# Patient Record
Sex: Female | Born: 1985 | Race: Asian | Hispanic: No | Marital: Married | Smoking: Never smoker
Health system: Southern US, Community
[De-identification: ages and names within clinical notes are randomized; demographics above are authoritative.]

## PROBLEM LIST (undated history)

## (undated) DIAGNOSIS — Z789 Other specified health status: Secondary | ICD-10-CM

## (undated) DIAGNOSIS — R768 Other specified abnormal immunological findings in serum: Secondary | ICD-10-CM

## (undated) DIAGNOSIS — J189 Pneumonia, unspecified organism: Secondary | ICD-10-CM

## (undated) HISTORY — PX: WISDOM TOOTH EXTRACTION: SHX21

---

## 2020-05-06 DIAGNOSIS — N764 Abscess of vulva: Secondary | ICD-10-CM

## 2020-05-06 HISTORY — DX: Abscess of vulva: N76.4

## 2020-07-25 DIAGNOSIS — K219 Gastro-esophageal reflux disease without esophagitis: Secondary | ICD-10-CM | POA: Diagnosis not present

## 2020-07-25 DIAGNOSIS — K6289 Other specified diseases of anus and rectum: Secondary | ICD-10-CM | POA: Diagnosis not present

## 2020-07-26 ENCOUNTER — Inpatient Hospital Stay (HOSPITAL_COMMUNITY)
Admission: EM | Admit: 2020-07-26 | Discharge: 2020-07-27 | DRG: 746 | Disposition: A | Discharge status: Home / Self Care | Payer: 59 | Attending: General Surgery | Admitting: General Surgery

## 2020-07-26 ENCOUNTER — Emergency Department (HOSPITAL_COMMUNITY): Payer: 59 | Admitting: Anesthesiology

## 2020-07-26 ENCOUNTER — Emergency Department (HOSPITAL_COMMUNITY): Payer: 59

## 2020-07-26 ENCOUNTER — Encounter (HOSPITAL_COMMUNITY): Admission: EM | Disposition: A | Discharge status: Home / Self Care | Payer: Self-pay | Source: Home / Self Care

## 2020-07-26 ENCOUNTER — Encounter (HOSPITAL_COMMUNITY): Payer: Self-pay

## 2020-07-26 ENCOUNTER — Other Ambulatory Visit: Payer: Self-pay

## 2020-07-26 DIAGNOSIS — N762 Acute vulvitis: Secondary | ICD-10-CM | POA: Diagnosis present

## 2020-07-26 DIAGNOSIS — Z20822 Contact with and (suspected) exposure to covid-19: Secondary | ICD-10-CM | POA: Diagnosis not present

## 2020-07-26 DIAGNOSIS — N764 Abscess of vulva: Secondary | ICD-10-CM | POA: Diagnosis not present

## 2020-07-26 DIAGNOSIS — R1900 Intra-abdominal and pelvic swelling, mass and lump, unspecified site: Secondary | ICD-10-CM | POA: Diagnosis not present

## 2020-07-26 DIAGNOSIS — K769 Liver disease, unspecified: Secondary | ICD-10-CM | POA: Diagnosis not present

## 2020-07-26 DIAGNOSIS — L0291 Cutaneous abscess, unspecified: Secondary | ICD-10-CM | POA: Diagnosis present

## 2020-07-26 DIAGNOSIS — K611 Rectal abscess: Secondary | ICD-10-CM | POA: Diagnosis not present

## 2020-07-26 DIAGNOSIS — K610 Anal abscess: Secondary | ICD-10-CM | POA: Diagnosis present

## 2020-07-26 DIAGNOSIS — N949 Unspecified condition associated with female genital organs and menstrual cycle: Secondary | ICD-10-CM | POA: Diagnosis not present

## 2020-07-26 DIAGNOSIS — K644 Residual hemorrhoidal skin tags: Secondary | ICD-10-CM | POA: Diagnosis not present

## 2020-07-26 HISTORY — PX: INCISION AND DRAINAGE PERIRECTAL ABSCESS: SHX1804

## 2020-07-26 LAB — BASIC METABOLIC PANEL
Anion gap: 7 (ref 5–15)
BUN: 10 mg/dL (ref 6–20)
CO2: 22 mmol/L (ref 22–32)
Calcium: 8.9 mg/dL (ref 8.9–10.3)
Chloride: 107 mmol/L (ref 98–111)
Creatinine, Ser: 0.65 mg/dL (ref 0.44–1.00)
GFR, Estimated: >60 mL/min (ref >60)
Glucose, Bld: 105 mg/dL — ABNORMAL HIGH (ref 70–99)
Potassium: 3.5 mmol/L (ref 3.5–5.1)
Sodium: 136 mmol/L (ref 135–145)

## 2020-07-26 LAB — CBC WITH DIFFERENTIAL/PLATELET
Abs Immature Granulocytes: 0.04 10*3/uL (ref 0.00–0.07)
Basophils Absolute: 0 10*3/uL (ref 0.0–0.1)
Basophils Relative: 0 %
Eosinophils Absolute: 0 10*3/uL (ref 0.0–0.5)
Eosinophils Relative: 0 %
HCT: 38.3 % (ref 36.0–46.0)
Hemoglobin: 12.9 g/dL (ref 12.0–15.0)
Immature Granulocytes: 0 %
Lymphocytes Relative: 10 %
Lymphs Abs: 1.3 10*3/uL (ref 0.7–4.0)
MCH: 30.4 pg (ref 26.0–34.0)
MCHC: 33.7 g/dL (ref 30.0–36.0)
MCV: 90.1 fL (ref 80.0–100.0)
Monocytes Absolute: 0.8 10*3/uL (ref 0.1–1.0)
Monocytes Relative: 6 %
Neutro Abs: 11.3 10*3/uL — ABNORMAL HIGH (ref 1.7–7.7)
Neutrophils Relative %: 84 %
Platelets: 193 10*3/uL (ref 150–400)
RBC: 4.25 MIL/uL (ref 3.87–5.11)
RDW: 12 % (ref 11.5–15.5)
WBC: 13.5 10*3/uL — ABNORMAL HIGH (ref 4.0–10.5)
nRBC: 0 % (ref 0.0–0.2)

## 2020-07-26 LAB — RESP PANEL BY RT-PCR (FLU A&B, COVID) ARPGX2
Influenza A by PCR: NEGATIVE
Influenza B by PCR: NEGATIVE
SARS Coronavirus 2 by RT PCR: NEGATIVE

## 2020-07-26 LAB — WET PREP, GENITAL
Clue Cells Wet Prep HPF POC: NONE SEEN
Sperm: NONE SEEN
Trich, Wet Prep: NONE SEEN
Yeast Wet Prep HPF POC: NONE SEEN

## 2020-07-26 LAB — I-STAT BETA HCG BLOOD, ED (MC, WL, AP ONLY): I-stat hCG, quantitative: <5 m[IU]/mL (ref <5)

## 2020-07-26 LAB — HIV ANTIBODY (ROUTINE TESTING W REFLEX): HIV Screen 4th Generation wRfx: NONREACTIVE

## 2020-07-26 LAB — LACTIC ACID, PLASMA: Lactic Acid, Venous: 1.2 mmol/L (ref 0.5–1.9)

## 2020-07-26 IMAGING — CT CT ABD-PELV W/ CM
2 of 4 series · 15 of 46 positions shown, 17 images · IV contrast (omnipaque)
Comparison: None.

CLINICAL DATA: Labial pain and swelling.

EXAM:
CT ABDOMEN AND PELVIS WITH CONTRAST
TECHNIQUE: Multidetector CT imaging of the abdomen and pelvis was performed
using the standard protocol following bolus administration of
intravenous contrast.
CONTRAST:  100mL OMNIPAQUE IOHEXOL 300 MG/ML  SOLN

[Series 3: a/p w/ 5mm · axial · 0.87mm/px · z∈[+1217,+1747]mm · 12 of 116 slices shown, 14 images]
[im 5/116  soft-tissue]
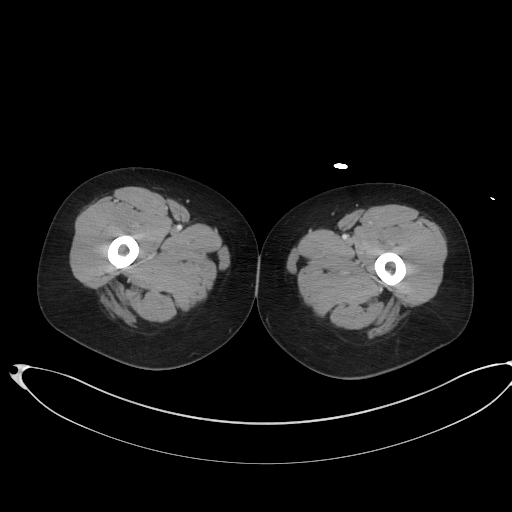
[im 5/116  bone]
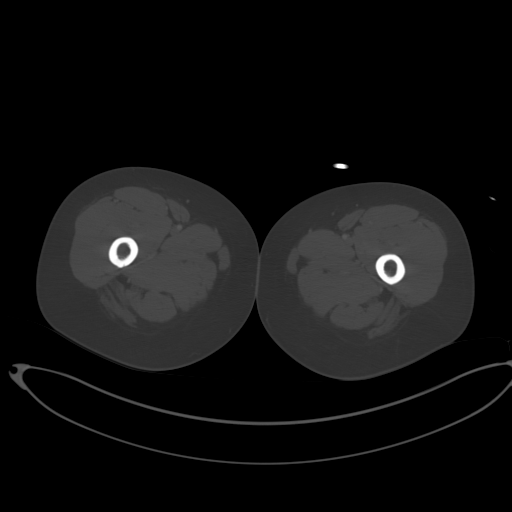
[im 15/116  soft-tissue]
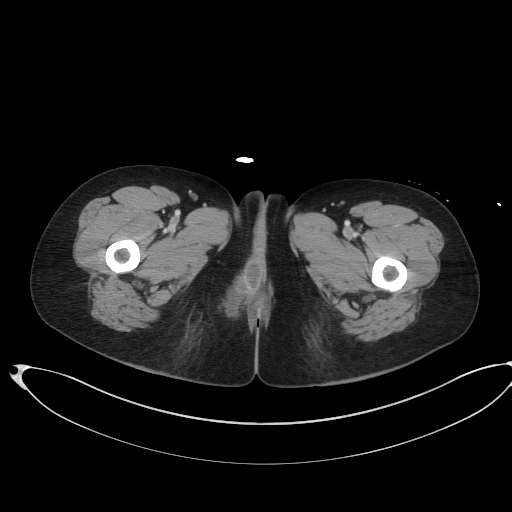
[im 24/116  soft-tissue]
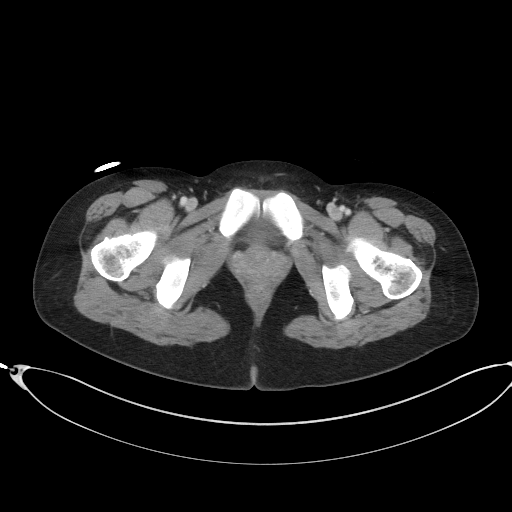
[im 34/116  soft-tissue]
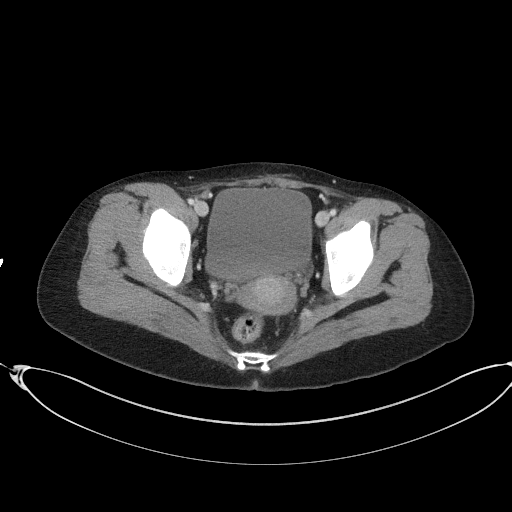
[im 44/116  soft-tissue]
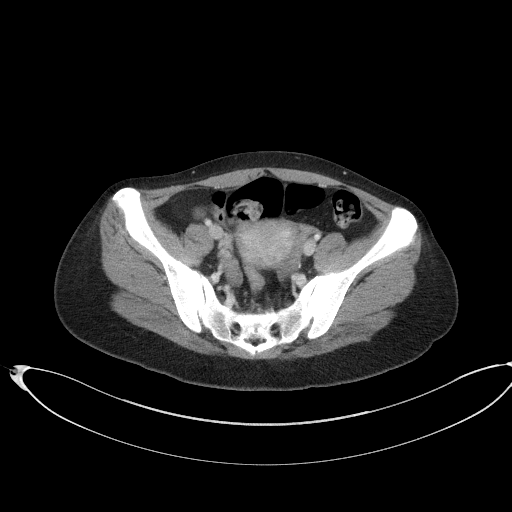
[im 53/116  soft-tissue]
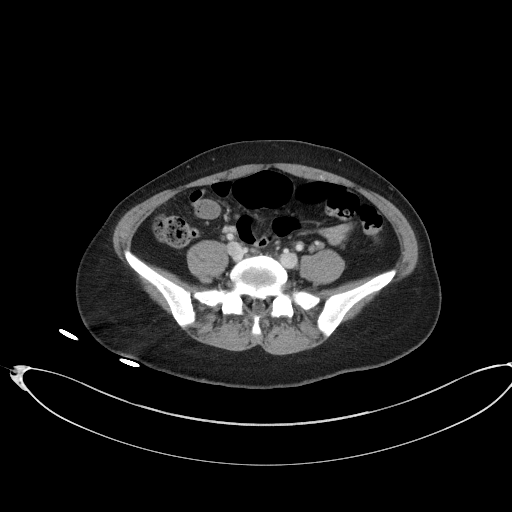
[im 63/116  soft-tissue]
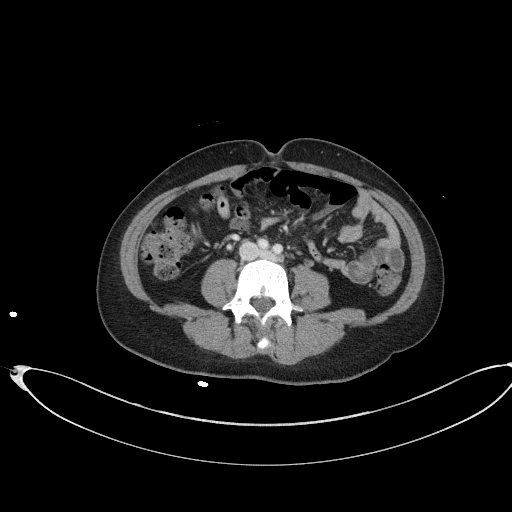
[im 72/116  soft-tissue]
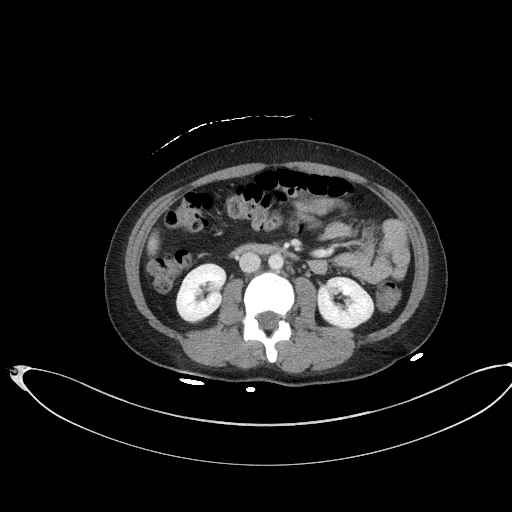
[im 82/116  soft-tissue]
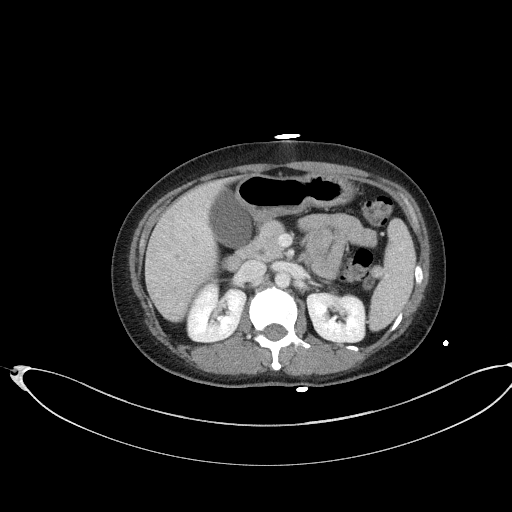
[im 82/116  bone]
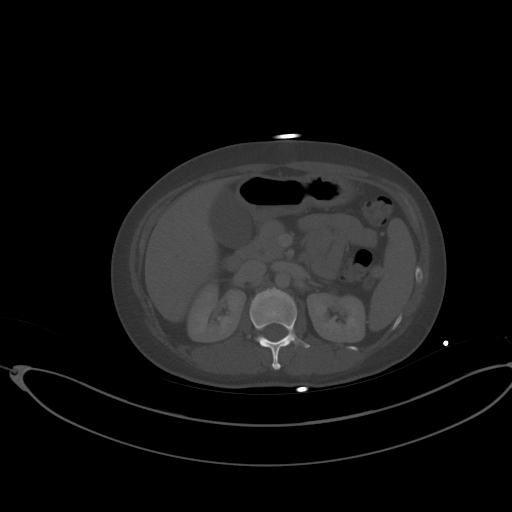
[im 92/116  soft-tissue]
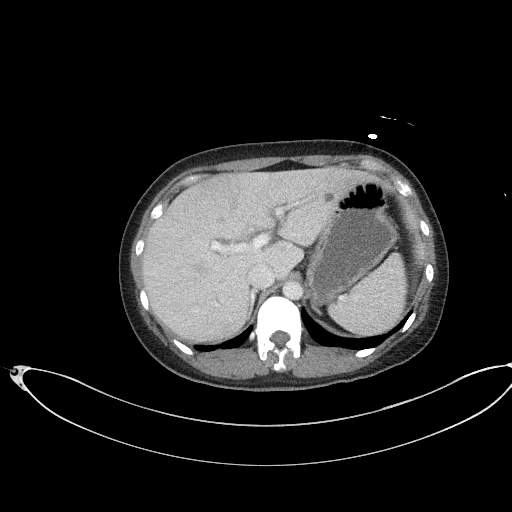
[im 101/116  soft-tissue]
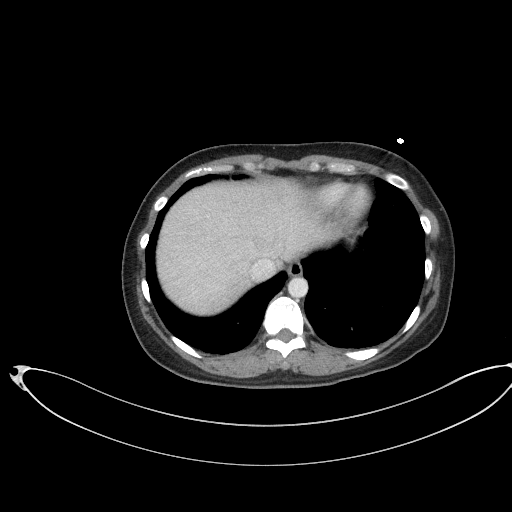
[im 111/116  soft-tissue]
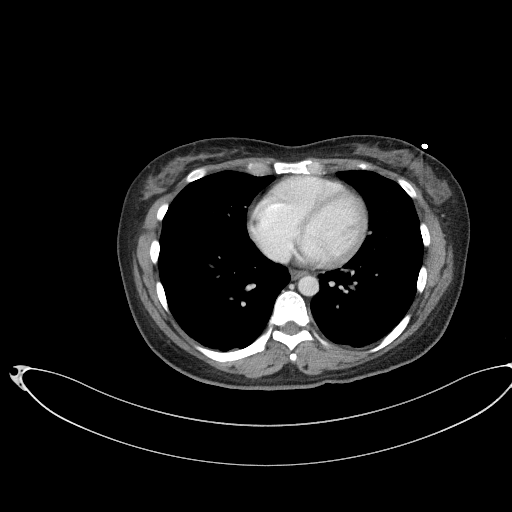

[Series 6: a/p w/ cor · coronal · 0.80mm/px · 3 of 115 slices shown]
[im 39/115  soft-tissue]
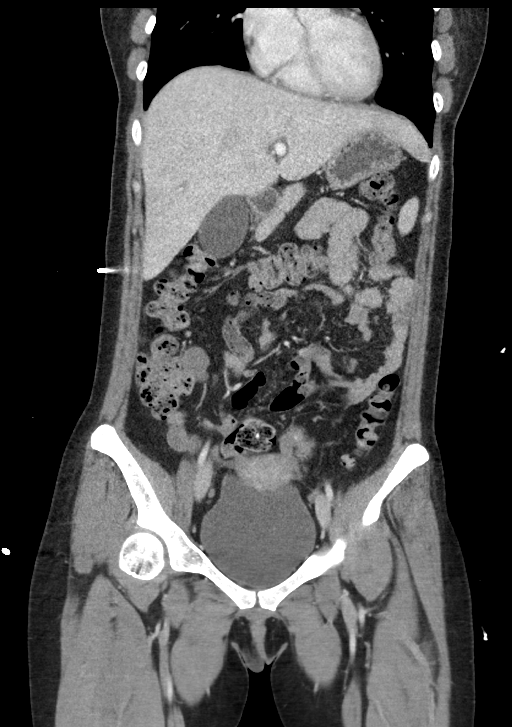
[im 51/115  soft-tissue]
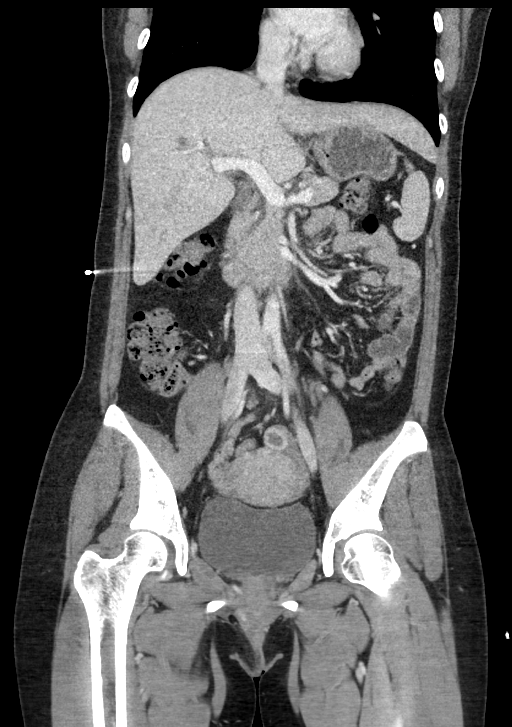
[im 64/115  soft-tissue]
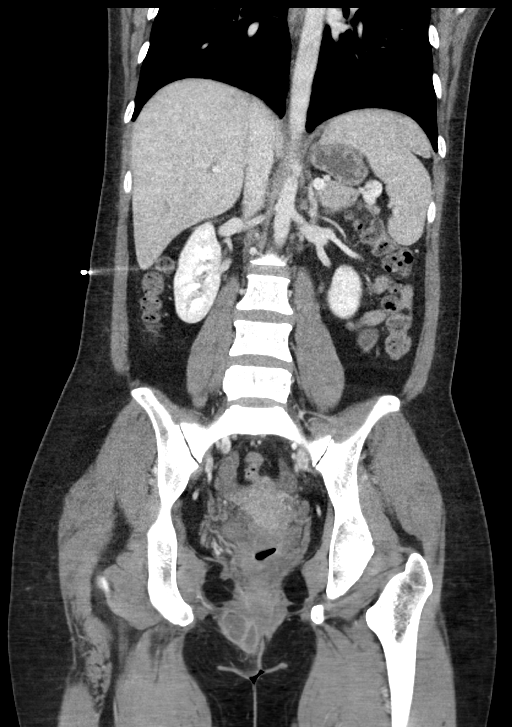

[15 of 46 positions shown; findings below may reference images not displayed]

FINDINGS: Lower chest: The lung bases are clear of acute process. No pleural
effusion or pulmonary lesions. The heart is normal in size. No
pericardial effusion. The distal esophagus and aorta are
unremarkable.

Hepatobiliary: Small low-attenuation liver lesions consistent with
benign cysts. No worrisome hepatic lesions or intrahepatic biliary
dilatation. The gallbladder is unremarkable. No common bile duct
dilatation.

Pancreas: No mass, inflammation or ductal dilatation.

Spleen: Normal size.  No focal lesions.

Adrenals/Urinary Tract: The adrenal glands and kidneys are normal.
The bladder is normal.

Stomach/Bowel: The stomach, duodenum, small bowel and colon are
unremarkable. No acute inflammatory changes, mass lesions or
obstructive findings. The terminal ileum is normal. The appendix is
normal.

Vascular/Lymphatic: The aorta is normal in caliber. No dissection.
The branch vessels are patent. The major venous structures are
patent. No mesenteric or retroperitoneal mass or adenopathy. Small
scattered lymph nodes are noted.

Reproductive: The uterus and ovaries are unremarkable.

Other: There is a complex rim enhancing fluid collection noted in
the right perineum. This is in close proximity to the lower anus and
could potentially be due to a perianal fistula although I do not see
one for certain. The other possibility would be some type infected
lower para vaginal cyst such as a Bartholin's gland abscess. MRI
pelvis without and with contrast using perianal fistula protocol may
be helpful for further evaluation if clinically necessary. This
abscess measures a maximum of 4.8 cm.

Musculoskeletal: No significant bony findings.
IMPRESSION: 1. 4.8 cm complex rim enhancing fluid collection in the right
perineum in close proximity to the anterior lower anus and could
potentially be due to a perianal fistula although I do not see one
for certain. The other possibility would be some type infected lower
paravaginal cyst such as a Bartholin's gland abscess. MRI pelvis
without and with contrast using perianal fistula protocol may be
helpful for further evaluation if clinically necessary.
2. No acute abdominal/pelvic findings, mass lesions or adenopathy.

## 2020-07-26 SURGERY — INCISION AND DRAINAGE, ABSCESS, PERIRECTAL
Anesthesia: General | Laterality: Right

## 2020-07-26 MED ORDER — EPHEDRINE 5 MG/ML INJ
INTRAVENOUS | Status: AC
  Filled 2020-07-26: qty 10

## 2020-07-26 MED ORDER — ONDANSETRON HCL 4 MG/2ML IJ SOLN
4.0000 mg | Freq: Once | INTRAMUSCULAR | Status: AC
  Administered 2020-07-26: 4 mg via INTRAVENOUS
  Filled 2020-07-26: qty 2

## 2020-07-26 MED ORDER — SUCCINYLCHOLINE CHLORIDE 20 MG/ML IJ SOLN
INTRAMUSCULAR | Status: DC | PRN
  Administered 2020-07-26: 100 mg via INTRAVENOUS

## 2020-07-26 MED ORDER — TRAMADOL HCL 50 MG PO TABS
50.0000 mg | ORAL_TABLET | Freq: Four times a day (QID) | ORAL | Status: DC | PRN
  Administered 2020-07-27: 50 mg via ORAL
  Filled 2020-07-26: qty 1

## 2020-07-26 MED ORDER — MIDAZOLAM HCL 2 MG/2ML IJ SOLN
INTRAMUSCULAR | Status: AC
  Filled 2020-07-26: qty 2

## 2020-07-26 MED ORDER — LIDOCAINE 2% (20 MG/ML) 5 ML SYRINGE
INTRAMUSCULAR | Status: AC
  Filled 2020-07-26: qty 5

## 2020-07-26 MED ORDER — ACETAMINOPHEN 10 MG/ML IV SOLN: 1000.0000 mg | Freq: Once | INTRAVENOUS | Status: DC | PRN

## 2020-07-26 MED ORDER — ONDANSETRON HCL 4 MG/2ML IJ SOLN
INTRAMUSCULAR | Status: DC | PRN
  Administered 2020-07-26: 4 mg via INTRAVENOUS

## 2020-07-26 MED ORDER — ADULT MULTIVITAMIN W/MINERALS CH
1.0000 | ORAL_TABLET | Freq: Every day | ORAL | Status: DC
  Administered 2020-07-27: 1 via ORAL
  Filled 2020-07-26: qty 1

## 2020-07-26 MED ORDER — PROPOFOL 10 MG/ML IV BOLUS
INTRAVENOUS | Status: DC | PRN
  Administered 2020-07-26: 130 mg via INTRAVENOUS

## 2020-07-26 MED ORDER — PIPERACILLIN-TAZOBACTAM 3.375 G IVPB 30 MIN
3.3750 g | Freq: Once | INTRAVENOUS | Status: AC
  Administered 2020-07-26: 3.375 g via INTRAVENOUS
  Filled 2020-07-26: qty 50

## 2020-07-26 MED ORDER — OXYCODONE-ACETAMINOPHEN 5-325 MG PO TABS
1.0000 | ORAL_TABLET | Freq: Once | ORAL | Status: AC
  Administered 2020-07-26: 1 via ORAL
  Filled 2020-07-26: qty 1

## 2020-07-26 MED ORDER — OXYCODONE HCL 5 MG PO TABS: 5.0000 mg | ORAL_TABLET | Freq: Once | ORAL | Status: DC | PRN

## 2020-07-26 MED ORDER — DOCUSATE SODIUM 100 MG PO CAPS
100.0000 mg | ORAL_CAPSULE | Freq: Two times a day (BID) | ORAL | Status: DC
  Administered 2020-07-26 – 2020-07-27 (×2): 100 mg via ORAL
  Filled 2020-07-26 (×3): qty 1

## 2020-07-26 MED ORDER — IBUPROFEN 400 MG PO TABS
400.0000 mg | ORAL_TABLET | Freq: Once | ORAL | Status: AC
  Administered 2020-07-26: 400 mg via ORAL
  Filled 2020-07-26: qty 1

## 2020-07-26 MED ORDER — ACETAMINOPHEN 500 MG PO TABS
1000.0000 mg | ORAL_TABLET | Freq: Three times a day (TID) | ORAL | Status: DC
  Administered 2020-07-26 – 2020-07-27 (×2): 1000 mg via ORAL
  Filled 2020-07-26 (×3): qty 2

## 2020-07-26 MED ORDER — ROCURONIUM BROMIDE 10 MG/ML (PF) SYRINGE
PREFILLED_SYRINGE | INTRAVENOUS | Status: AC
  Filled 2020-07-26: qty 10

## 2020-07-26 MED ORDER — IBUPROFEN 200 MG PO TABS: 200.0000 mg | ORAL_TABLET | Freq: Four times a day (QID) | ORAL | Status: DC | PRN

## 2020-07-26 MED ORDER — FENTANYL CITRATE (PF) 100 MCG/2ML IJ SOLN
25.0000 ug | INTRAMUSCULAR | Status: DC | PRN
Start: 2020-07-26 — End: 2020-07-26

## 2020-07-26 MED ORDER — PHENYLEPHRINE 40 MCG/ML (10ML) SYRINGE FOR IV PUSH (FOR BLOOD PRESSURE SUPPORT)
PREFILLED_SYRINGE | INTRAVENOUS | Status: AC
  Filled 2020-07-26: qty 10

## 2020-07-26 MED ORDER — ONDANSETRON 4 MG PO TBDP: 4.0000 mg | ORAL_TABLET | Freq: Four times a day (QID) | ORAL | Status: DC | PRN

## 2020-07-26 MED ORDER — MIDAZOLAM HCL 5 MG/5ML IJ SOLN
INTRAMUSCULAR | Status: DC | PRN
  Administered 2020-07-26: 2 mg via INTRAVENOUS

## 2020-07-26 MED ORDER — LACTATED RINGERS IV SOLN: INTRAVENOUS | Status: DC | PRN

## 2020-07-26 MED ORDER — FENTANYL CITRATE (PF) 250 MCG/5ML IJ SOLN
INTRAMUSCULAR | Status: AC
  Filled 2020-07-26: qty 5

## 2020-07-26 MED ORDER — HYDROMORPHONE HCL 1 MG/ML IJ SOLN
0.5000 mg | Freq: Once | INTRAMUSCULAR | Status: AC
  Administered 2020-07-26: 0.5 mg via INTRAVENOUS
  Filled 2020-07-26: qty 1

## 2020-07-26 MED ORDER — DEXAMETHASONE SODIUM PHOSPHATE 10 MG/ML IJ SOLN
INTRAMUSCULAR | Status: DC | PRN
  Administered 2020-07-26: 5 mg via INTRAVENOUS

## 2020-07-26 MED ORDER — ONDANSETRON HCL 4 MG/2ML IJ SOLN
INTRAMUSCULAR | Status: AC
  Filled 2020-07-26: qty 2

## 2020-07-26 MED ORDER — SUCCINYLCHOLINE CHLORIDE 200 MG/10ML IV SOSY
PREFILLED_SYRINGE | INTRAVENOUS | Status: AC
  Filled 2020-07-26: qty 10

## 2020-07-26 MED ORDER — 0.9 % SODIUM CHLORIDE (POUR BTL) OPTIME
TOPICAL | Status: DC | PRN
  Administered 2020-07-26: 1000 mL

## 2020-07-26 MED ORDER — LIDOCAINE-EPINEPHRINE 1 %-1:100000 IJ SOLN
20.0000 mL | Freq: Once | INTRAMUSCULAR | Status: DC
  Filled 2020-07-26: qty 1

## 2020-07-26 MED ORDER — PROPOFOL 10 MG/ML IV BOLUS
INTRAVENOUS | Status: AC
  Filled 2020-07-26: qty 20

## 2020-07-26 MED ORDER — IOHEXOL 300 MG/ML  SOLN
100.0000 mL | Freq: Once | INTRAMUSCULAR | Status: AC | PRN
  Administered 2020-07-26: 100 mL via INTRAVENOUS

## 2020-07-26 MED ORDER — AMOXICILLIN-POT CLAVULANATE 875-125 MG PO TABS
1.0000 | ORAL_TABLET | Freq: Two times a day (BID) | ORAL | Status: DC
  Administered 2020-07-26 – 2020-07-27 (×2): 1 via ORAL
  Filled 2020-07-26 (×2): qty 1

## 2020-07-26 MED ORDER — OXYCODONE HCL 5 MG/5ML PO SOLN: 5.0000 mg | Freq: Once | ORAL | Status: DC | PRN

## 2020-07-26 MED ORDER — SODIUM CHLORIDE 0.9 % IV BOLUS
1000.0000 mL | Freq: Once | INTRAVENOUS | Status: AC
  Administered 2020-07-26: 1000 mL via INTRAVENOUS

## 2020-07-26 MED ORDER — LIDOCAINE HCL (CARDIAC) PF 100 MG/5ML IV SOSY
PREFILLED_SYRINGE | INTRAVENOUS | Status: DC | PRN
  Administered 2020-07-26: 40 mg via INTRAVENOUS

## 2020-07-26 MED ORDER — ONDANSETRON HCL 4 MG/2ML IJ SOLN: 4.0000 mg | Freq: Four times a day (QID) | INTRAMUSCULAR | Status: DC | PRN

## 2020-07-26 MED ORDER — ACETAMINOPHEN 10 MG/ML IV SOLN
INTRAVENOUS | Status: DC | PRN
  Administered 2020-07-26: 1000 mg via INTRAVENOUS

## 2020-07-26 MED ORDER — FENTANYL CITRATE (PF) 250 MCG/5ML IJ SOLN
INTRAMUSCULAR | Status: DC | PRN
  Administered 2020-07-26: 100 ug via INTRAVENOUS

## 2020-07-26 MED ORDER — ACETAMINOPHEN 500 MG PO TABS: 1000.0000 mg | ORAL_TABLET | Freq: Once | ORAL | Status: DC | PRN

## 2020-07-26 MED ORDER — ACETAMINOPHEN 160 MG/5ML PO SOLN: 1000.0000 mg | Freq: Once | ORAL | Status: DC | PRN

## 2020-07-26 SURGICAL SUPPLY — 30 items
BNDG GAUZE ELAST 4 BULKY (GAUZE/BANDAGES/DRESSINGS) IMPLANT
COVER MAYO STAND STRL (DRAPES) ×2 IMPLANT
COVER SURGICAL LIGHT HANDLE (MISCELLANEOUS) ×2 IMPLANT
COVER WAND RF STERILE (DRAPES) ×2 IMPLANT
DRSG PAD ABDOMINAL 8X10 ST (GAUZE/BANDAGES/DRESSINGS) ×2 IMPLANT
ELECT CAUTERY BLADE 6.4 (BLADE) ×2 IMPLANT
ELECT REM PT RETURN 9FT ADLT (ELECTROSURGICAL) ×2
ELECTRODE REM PT RTRN 9FT ADLT (ELECTROSURGICAL) ×1 IMPLANT
GAUZE PACKING IODOFORM 1/2 (PACKING) ×2 IMPLANT
GAUZE SPONGE 4X4 12PLY STRL (GAUZE/BANDAGES/DRESSINGS) ×2 IMPLANT
GLOVE BIOGEL PI IND STRL 6 (GLOVE) ×1 IMPLANT
GLOVE BIOGEL PI INDICATOR 6 (GLOVE) ×1
GLOVE BIOGEL PI MICRO 5.5 (GLOVE) ×1
GLOVE BIOGEL PI MICRO STRL 5.5 (GLOVE) ×1 IMPLANT
GOWN STRL REUS W/ TWL LRG LVL3 (GOWN DISPOSABLE) ×2 IMPLANT
GOWN STRL REUS W/TWL LRG LVL3 (GOWN DISPOSABLE) ×2
KIT BASIN OR (CUSTOM PROCEDURE TRAY) ×2 IMPLANT
KIT TURNOVER KIT B (KITS) ×2 IMPLANT
NS IRRIG 1000ML POUR BTL (IV SOLUTION) ×2 IMPLANT
PACK GENERAL/GYN (CUSTOM PROCEDURE TRAY) ×2 IMPLANT
PACK LITHOTOMY IV (CUSTOM PROCEDURE TRAY) ×2 IMPLANT
PAD ARMBOARD 7.5X6 YLW CONV (MISCELLANEOUS) ×2 IMPLANT
PENCIL SMOKE EVACUATOR (MISCELLANEOUS) ×2 IMPLANT
SPONGE LAP 18X18 RF (DISPOSABLE) ×2 IMPLANT
SURGILUBE 2OZ TUBE FLIPTOP (MISCELLANEOUS) ×2 IMPLANT
SYR BULB EAR ULCER 3OZ GRN STR (SYRINGE) ×2 IMPLANT
TOWEL GREEN STERILE (TOWEL DISPOSABLE) ×2 IMPLANT
TOWEL GREEN STERILE FF (TOWEL DISPOSABLE) ×2 IMPLANT
TUBE CONNECTING 12X1/4 (SUCTIONS) ×2 IMPLANT
YANKAUER SUCT BULB TIP NO VENT (SUCTIONS) ×2 IMPLANT

## 2020-07-26 NOTE — H&P (Signed)
Sabrina Mullins [birth year]  [id number].    Chief Complaint/Reason for Consult: labial/perianal abscess  HPI:  Sabrina Mullins is a 35 yo female who has been having pain on her right labia and perianal area for the last 6 days. Sabrina Mullins has not had any fevers, chills, or drainage. Sabrina Mullins thought Sabrina Mullins had hemorrhoids and was seen by GI, who referred her to our clinic today. Sabrina Mullins was seen by Dr. Cliffton Asters in clinic this morning, and there was induration with tenderness of the right posterior labia but no fluctuance or obvious abscess. Sabrina Mullins was sent to the ED for imaging. A CT scan showed a 5cm rim-enhancing fluid collection in the right perineum, tracking toward the anus. Labs are significant for a WBC of 13. Attempts were made in the ED to aspirate and drain the abscess but were not successful.  ROS: Review of Systems  Constitutional: Negative for chills and fever.  Respiratory: Negative for shortness of breath.   Cardiovascular: Negative for chest pain.  Genitourinary: Positive for dysuria.  Neurological: Negative for weakness.    History reviewed. No pertinent family history.  History reviewed. No pertinent past medical history.  History reviewed. No pertinent surgical history.  Social History: Denies EtOH and smoking. Works as an Airline pilot.  Allergies: Not on File  (Not in a hospital admission)    Physical Exam: Blood pressure 99/73, pulse 84, temperature 98.5 F (36.9 C), resp. rate 18, SpO2 98 %. General: resting comfortably, appears stated age, no apparent distress Neurological: alert and oriented, no focal deficits, cranial nerves grossly in tact HEENT: normocephalic, atraumatic, oropharynx clear, no scleral icterus CV: regular rate and rhythm, extremities warm and well-perfused Respiratory: normal work of breathing, symmetric chest wall expansion Anorectal/GU: induration and mild erythema of the right posterior labia with exquisite tenderness to palpation. Induration tracks onto the perineum  toward the right anterior perianal tissue, which is also tender to palpation. Non-thrombosed external hemorrhoids. Extremities: warm and well-perfused, no deformities, moving all extremities spontaneously Psychiatric: normal mood and affect Skin: warm and dry, no jaundice, no rashes or lesions   Results for orders placed or performed during the hospital encounter of 07/26/20 (from the past 48 hour(s))  Wet prep, genital     Status: Abnormal   Collection Time: 07/26/20  2:11 PM  Result Value Ref Range   Yeast Wet Prep HPF POC NONE SEEN NONE SEEN   Trich, Wet Prep NONE SEEN NONE SEEN   Clue Cells Wet Prep HPF POC NONE SEEN NONE SEEN   WBC, Wet Prep HPF POC MANY (A) NONE SEEN   Sperm NONE SEEN     Comment: Performed at [facility], [address], [place], [place] [address]  CBC with Differential/Platelet     Status: Abnormal   Collection Time: 07/26/20  4:44 PM  Result Value Ref Range   WBC 13.5 (H) 4.0 - 10.5 K/uL   RBC 4.25 3.87 - 5.11 MIL/uL   Hemoglobin 12.9 12.0 - 15.0 g/dL   HCT 16.0 10.9 - 32.3 %   MCV 90.1 80.0 - 100.0 fL   MCH 30.4 26.0 - 34.0 pg   MCHC 33.7 30.0 - 36.0 g/dL   RDW 55.7 32.2 - 02.5 %   Platelets 193 150 - 400 K/uL   nRBC 0.0 0.0 - 0.2 %   Neutrophils Relative % 84 %   Neutro Abs 11.3 (H) 1.7 - 7.7 K/uL   Lymphocytes Relative 10 %   Lymphs Abs 1.3 0.7 - 4.0  K/uL   Monocytes Relative 6 %   Monocytes Absolute 0.8 0.1 - 1.0 K/uL   Eosinophils Relative 0 %   Eosinophils Absolute 0.0 0.0 - 0.5 K/uL   Basophils Relative 0 %   Basophils Absolute 0.0 0.0 - 0.1 K/uL   Immature Granulocytes 0 %   Abs Immature Granulocytes 0.04 0.00 - 0.07 K/uL    Comment: Performed at [facility], [address], [place], [place] [address]  Basic metabolic panel     Status: Abnormal   Collection Time: 07/26/20  4:44 PM  Result Value Ref Range   Sodium 136 135 - 145 mmol/L   Potassium 3.5 3.5 - 5.1 mmol/L   Chloride 107 98 - 111 mmol/L   CO2 22 22 - 32 mmol/L    Glucose, Bld 105 (H) 70 - 99 mg/dL    Comment: Glucose reference range applies only to samples taken after fasting for at least 8 hours.   BUN 10 6 - 20 mg/dL   Creatinine, Ser 4.70 0.44 - 1.00 mg/dL   Calcium 8.9 8.9 - 96.2 mg/dL   GFR, Estimated >83 >66 mL/min    Comment: (NOTE) Calculated using the CKD-EPI Creatinine Equation (2021)    Anion gap 7 5 - 15    Comment: Performed at [facility], [address] [place], [place] [address]  HIV Antibody (routine testing w rflx)     Status: None   Collection Time: 07/26/20  4:44 PM  Result Value Ref Range   HIV Screen 4th Generation wRfx Non Reactive Non Reactive    Comment: Performed at [facility], [address] [place], [place] [address]  Lactic acid, plasma     Status: None   Collection Time: 07/26/20  4:46 PM  Result Value Ref Range   Lactic Acid, Venous 1.2 0.5 - 1.9 mmol/L    Comment: Performed at [facility], [address], [place], [place] [address]  I-Stat Beta hCG blood, ED ([facility], [facility], [facility] only)     Status: None   Collection Time: 07/26/20  5:48 PM  Result Value Ref Range   I-stat hCG, quantitative <5.0 <5 mIU/mL   Comment 3            Comment:   GEST. AGE      CONC.  (mIU/mL)   <=1 WEEK        5 - 50     2 WEEKS       50 - 500     3 WEEKS       100 - 10,000     4 WEEKS     1,000 - 30,000        FEMALE AND NON-PREGNANT FEMALE:     LESS THAN 5 mIU/mL    CT ABDOMEN PELVIS W CONTRAST  Result Date: 07/26/2020 CLINICAL DATA:  Labial pain and swelling. EXAM: CT ABDOMEN AND PELVIS WITH CONTRAST TECHNIQUE: Multidetector CT imaging of the abdomen and pelvis was performed using the standard protocol following bolus administration of intravenous contrast. CONTRAST:  OMNIPAQUE IOHEXOL 300 MG/ML  SOLN COMPARISON:  None. FINDINGS: Lower chest: The lung bases are clear of acute process. No pleural effusion or pulmonary lesions. The heart is normal in size. No pericardial effusion. The distal esophagus and  aorta are unremarkable. Hepatobiliary: Small low-attenuation liver lesions consistent with benign cysts. No worrisome hepatic lesions or intrahepatic biliary dilatation. The gallbladder is unremarkable. No common bile duct dilatation. Pancreas: No  mass, inflammation or ductal dilatation. Spleen: Normal size.  No focal lesions. Adrenals/Urinary Tract: The adrenal glands and kidneys are normal. The bladder is normal. Stomach/Bowel: The stomach, duodenum, small bowel and colon are unremarkable. No acute inflammatory changes, mass lesions or obstructive findings. The terminal ileum is normal. The appendix is normal. Vascular/Lymphatic: The aorta is normal in caliber. No dissection. The branch vessels are patent. The major venous structures are patent. No mesenteric or retroperitoneal mass or adenopathy. Small scattered lymph nodes are noted. Reproductive: The uterus and ovaries are unremarkable. Other: There is a complex rim enhancing fluid collection noted in the right perineum. This is in close proximity to the lower anus and could potentially be due to a perianal fistula although I do not see one for certain. The other possibility would be some type infected lower para vaginal cyst such as a Bartholin's gland abscess. MRI pelvis without and with contrast using perianal fistula protocol may be helpful for further evaluation if clinically necessary. This abscess measures a maximum of 4.8 cm. Musculoskeletal: No significant bony findings. IMPRESSION: 1. 4.8 cm complex rim enhancing fluid collection in the right perineum in close proximity to the anterior lower anus and could potentially be due to a perianal fistula although I do not see one for certain. The other possibility would be some type infected lower paravaginal cyst such as a Bartholin's gland abscess. MRI pelvis without and with contrast using perianal fistula protocol may be helpful for further evaluation if clinically necessary. 2. No acute abdominal/pelvic  findings, mass lesions or adenopathy. Electronically Signed   By: Rudie Meyer M.D.   On: 07/26/2020 18:51      Assessment/Plan 35 yo female with an abscess of the right labium/perineum vs a perianal abscess. Will proceed to OR this evening for exam under anesthesia with incision and drainage. COVID test pending. Will likely keep overnight for observation and plan to discharge in the morning. Procedure details were discussed with the patient, risks and benefits reviewed, informed consent obtained. Sabrina Mullins already received a dose of antibiotics in the ED.   Sophronia Simas, MD [facility] General, Hepatobiliary and Pancreatic Surgery 07/26/20 8:13 PM

## 2020-07-26 NOTE — Anesthesia Preprocedure Evaluation (Signed)
Anesthesia Evaluation  Patient identified by MRN, date of birth, ID band Patient awake    Reviewed: Allergy & Precautions, NPO status , Patient's Chart, lab work & pertinent test results  History of Anesthesia Complications Negative for: history of anesthetic complications  Airway Mallampati: I  TM Distance: >3 FB Neck ROM: Full    Dental  (+) Dental Advisory Given, Teeth Intact   Pulmonary neg pulmonary ROS,  Covid-19 Nucleic Acid Test Results Lab Results      Component                Value               Date                      SARSCOV2NAA              NEGATIVE            07/26/2020              breath sounds clear to auscultation       Cardiovascular negative cardio ROS   Rhythm:Regular     Neuro/Psych negative neurological ROS  negative psych ROS   GI/Hepatic negative GI ROS, Neg liver ROS,   Endo/Other  negative endocrine ROS  Renal/GU negative Renal ROS     Musculoskeletal negative musculoskeletal ROS (+)   Abdominal   Peds  Hematology Lab Results      Component                Value               Date                      WBC                      13.5 (H)            07/26/2020                HGB                      12.9                07/26/2020                HCT                      38.3                07/26/2020                MCV                      90.1                07/26/2020                PLT                      193                 07/26/2020              Anesthesia Other Findings   Reproductive/Obstetrics Lab Results      Component  Value               Date                      HCG                      <5.0                07/26/2020                                        Anesthesia Physical Anesthesia Plan  ASA: I  Anesthesia Plan: General   Post-op Pain Management:    Induction: Intravenous  PONV Risk Score and Plan: 2 and Ondansetron  and Dexamethasone  Airway Management Planned: Oral ETT  Additional Equipment: None  Intra-op Plan:   Post-operative Plan: Extubation in OR  Informed Consent: I have reviewed the patients History and Physical, chart, labs and discussed the procedure including the risks, benefits and alternatives for the proposed anesthesia with the patient or authorized representative who has indicated his/her understanding and acceptance.     Dental advisory given  Plan Discussed with: CRNA and Surgeon  Anesthesia Plan Comments:         Anesthesia Quick Evaluation

## 2020-07-26 NOTE — ED Notes (Signed)
Dr. Freida Busman ( surgeon ) at bedside evaluating patient/ explained procedure and plan of care to patient .

## 2020-07-26 NOTE — Op Note (Signed)
Date: 07/26/20  Patient: Sabrina Mullins MRN: [id number]  Preoperative Diagnosis: Perineal abscess Postoperative Diagnosis: Right posterior labial abscess  Procedure: Incision and drainage of right labil abscecss  Surgeon: Sophronia Simas, MD  EBL: 10 mL  Anesthesia: General  Specimens: Culture - right labial abscess  Indications: [name] is a 35 yo female who presented with 6 days of worsening perineal pain. She was sent to the ED today and a CT scan showed an abscess adjacent to the vagina and rectum. She was brought to the operating room for exam under anesthesia and drainage.  Findings: Large abscess on the right posterior labium/perineum tracking superiorly. No communication with the rectum.  Procedure details: Informed consent was obtained in the preoperative area prior to the procedure. The patient was brought to the operating room and placed on the table in the supine position. General anesthesia was induced and appropriate lines and drains were placed for intraoperative monitoring. Perioperative antibiotics were administered per SCIP guidelines. The patient was prepped and draped in the usual sterile fashion. A pre-procedure timeout was taken verifying patient identity, surgical site and procedure to be performed.  There was induration and erythema on the right labia tracking posteriorly, with an area of fluctuance on the posterior labium near the vaginal introitus. The skin on this area was incised with an 11-blade scalpel, with return of copious purulent drainage. Cultures were sent. The wound was probed with hemostats and tracked superiorly. A digital rectal exam was done, and there was no evidence of communication between the rectum and the abscess cavity. The cavity was irrigated with saline and packed with 1/2-inch iodoform gauze. A clean dressing was applied.  The patient tolerated the procedure well with no apparent complications. All counts were correct x2 at the end of the  procedure. The patient was extubated and taken to PACU in stable condition.  Sophronia Simas, MD 07/26/20 10:03 PM

## 2020-07-26 NOTE — ED Provider Notes (Signed)
Physical Exam  BP 101/85 (BP Location: Right Arm)   Pulse 75   Temp (!) 97.2 F (36.2 C) (Oral)   Resp 16   SpO2 98%   Physical Exam Vitals and nursing note reviewed. Exam conducted with a chaperone present.  Constitutional:      General: She is not in acute distress.    Appearance: She is well-developed. She is not diaphoretic.  HENT:     Head: Normocephalic and atraumatic.  Eyes:     General: No scleral icterus.    Conjunctiva/sclera: Conjunctivae normal.  Pulmonary:     Effort: Pulmonary effort is normal. No respiratory distress.  Genitourinary:   Musculoskeletal:     Cervical back: Normal range of motion.  Skin:    Findings: No rash.  Neurological:     Mental Status: She is alert.     ED Course/Procedures   Clinical Course as of 07/26/20 2005  Wed Jul 26, 2020  1715 WBC(!): 13.5 [HK]  1715 Lactic Acid, Venous: 1.2 [HK]  1947 Spoke to on-call general surgery, Dr. Freida Busman.  She reviewed patient's CT findings and is concerned that this is in fact due to an abscess.  She will see the patient in consult at the bedside.  Asked that we order a rapid Covid test. [HK]    Clinical Course User Index [HK] Dietrich Pates, PA-C    Procedures  MDM    Care of patient assumed from PA Addison at 3:30 PM.  Agree with history, physical exam and plan.  See their note for further details.  Briefly, 35 y.o. female with PMH/PSH as below who presents with a chief complaint of abscess.  For the past 6 days has noticed some swelling and pain to her right labia.  Sent to [facility] surgery by her PCP today for evaluation of a hemorrhoid.  When she was seen today they were concerned about a labial abscess and advised that she come to the ER.  She denies any pain with bowel movements, vaginal complaints or bloody stools. Attempted to needle aspirate under ultrasound guidance without success. Receiving pain medication and IV antibiotics at this time.  History reviewed. No pertinent past  medical history. History reviewed. No pertinent surgical history.    Current Plan: Obtain lab work, blood cultures and CT of the abdomen pelvis to further characterize the cause of her pain and swelling.   MDM/ED Course: 7:30 PM CT concerning for possible 4.8 cm abscess but there is a possibility this could be a perianal fistula.  7:45 PM Spoke to general surgeon, Dr. Freida Busman who will evaluate the patient at the bedside.  8:08 PM Dr. Freida Busman informing that she will take patient to the OR for incision and drainage. Covid test collected and pending. Appreciate the help of general surgery for management of this patient   Consults: General surgery-Dr. Freida Busman   Significant labs/images: CT ABDOMEN PELVIS W CONTRAST  Result Date: 07/26/2020 CLINICAL DATA:  Labial pain and swelling. EXAM: CT ABDOMEN AND PELVIS WITH CONTRAST TECHNIQUE: Multidetector CT imaging of the abdomen and pelvis was performed using the standard protocol following bolus administration of intravenous contrast. CONTRAST:  OMNIPAQUE IOHEXOL 300 MG/ML  SOLN COMPARISON:  None. FINDINGS: Lower chest: The lung bases are clear of acute process. No pleural effusion or pulmonary lesions. The heart is normal in size. No pericardial effusion. The distal esophagus and aorta are unremarkable. Hepatobiliary: Small low-attenuation liver lesions consistent with benign cysts. No worrisome hepatic lesions or intrahepatic biliary dilatation.  The gallbladder is unremarkable. No common bile duct dilatation. Pancreas: No mass, inflammation or ductal dilatation. Spleen: Normal size.  No focal lesions. Adrenals/Urinary Tract: The adrenal glands and kidneys are normal. The bladder is normal. Stomach/Bowel: The stomach, duodenum, small bowel and colon are unremarkable. No acute inflammatory changes, mass lesions or obstructive findings. The terminal ileum is normal. The appendix is normal. Vascular/Lymphatic: The aorta is normal in caliber. No  dissection. The branch vessels are patent. The major venous structures are patent. No mesenteric or retroperitoneal mass or adenopathy. Small scattered lymph nodes are noted. Reproductive: The uterus and ovaries are unremarkable. Other: There is a complex rim enhancing fluid collection noted in the right perineum. This is in close proximity to the lower anus and could potentially be due to a perianal fistula although I do not see one for certain. The other possibility would be some type infected lower para vaginal cyst such as a Bartholin's gland abscess. MRI pelvis without and with contrast using perianal fistula protocol may be helpful for further evaluation if clinically necessary. This abscess measures a maximum of 4.8 cm. Musculoskeletal: No significant bony findings. IMPRESSION: 1. 4.8 cm complex rim enhancing fluid collection in the right perineum in close proximity to the anterior lower anus and could potentially be due to a perianal fistula although I do not see one for certain. The other possibility would be some type infected lower paravaginal cyst such as a Bartholin's gland abscess. MRI pelvis without and with contrast using perianal fistula protocol may be helpful for further evaluation if clinically necessary. 2. No acute abdominal/pelvic findings, mass lesions or adenopathy. Electronically Signed   By: Rudie Meyer M.D.   On: 07/26/2020 18:51    I personally reviewed and interpreted all labs.  The plan for this patient was discussed with Dr. Particia Nearing, who voiced agreement and who oversaw evaluation and treatment of this patient.        Dietrich Pates, PA-C 07/26/20 2010    Jacalyn Lefevre, MD 07/26/20 2022

## 2020-07-26 NOTE — Anesthesia Procedure Notes (Signed)
Procedure Name: Intubation Date/Time: 07/26/2020 9:42 PM Performed by: Claudina Lick, CRNA Pre-anesthesia Checklist: Patient identified, Emergency Drugs available, Suction available, Patient being monitored and Timeout performed Patient Re-evaluated:Patient Re-evaluated prior to induction Oxygen Delivery Method: Circle system utilized Preoxygenation: Pre-oxygenation with 100% oxygen Induction Type: IV induction, Rapid sequence and Cricoid Pressure applied Laryngoscope Size: Miller and 2 Grade View: Grade I Tube type: Oral Tube size: 7.0 mm Number of attempts: 1 Airway Equipment and Method: Stylet Placement Confirmation: ETT inserted through vocal cords under direct vision,  positive ETCO2 and breath sounds checked- equal and bilateral Secured at: 21 cm Tube secured with: Tape Dental Injury: Teeth and Oropharynx as per pre-operative assessment

## 2020-07-26 NOTE — ED Triage Notes (Signed)
Patient describes abscess to right side of labia x 6 days, reports pain and swelling. Was seen elsewhere and informed patient not a hemorrhoid but something involving labia.

## 2020-07-26 NOTE — ED Notes (Signed)
Patient signed consent form for her surgery .

## 2020-07-26 NOTE — ED Notes (Signed)
Report given to OR nurse anesthetist ,transported to OR .

## 2020-07-26 NOTE — Progress Notes (Signed)
Patient admitted to 6N08 from PACU. Alert and oriented x4. Vital signs within normal limits. Dressing to rectum clean and dry. Denies c/o pain at this time. Oriented to room and remote.

## 2020-07-26 NOTE — Transfer of Care (Signed)
Immediate Anesthesia Transfer of Care Note  Patient: Sabrina Mullins  Procedure(s) Performed: IRRIGATION AND DEBRIDEMENT right labial ABSCESS (Right )  Patient Location: PACU  Anesthesia Type:General  Level of Consciousness: drowsy  Airway & Oxygen Therapy: Patient Spontanous Breathing  Post-op Assessment: Report given to RN and Post -op Vital signs reviewed and stable  Post vital signs: Reviewed and stable  Last Vitals:  Vitals Value Taken Time  BP    Temp    Pulse 89 07/26/20 2212  Resp 21 07/26/20 2212  SpO2 92 % 07/26/20 2212  Vitals shown include unvalidated device data.  Last Pain:  Vitals:   07/26/20 2011  TempSrc:   PainSc: 0-No pain         Complications: No complications documented.

## 2020-07-26 NOTE — ED Provider Notes (Incomplete)
I provided a substantive portion of the care of this patient.  I personally performed the entirety of the history and exam for this encounter. {Remember to document shared critical care using "edcritical" dot phrase:1}

## 2020-07-26 NOTE — ED Provider Notes (Signed)
[facility] EMERGENCY DEPARTMENT Provider Note   CSN: [id number] Arrival date & time: 07/26/20  1008     History No chief complaint on file.   Sabrina Mullins is a 35 y.o. female.  HPI Patient is a 35 year old English speaking female.  She presents today with concern for pain on the right side of her labia for the past 6 days.  She states that she was sent to [facility] surgery by her PCP for evaluation of a hemorrhoid that she developed sometime ago she has been using cream for this and states that it has been generally improving.  She states that she was seen today at [facility] surgery until to come to the ER because this is a labial issue and not a hemorrhoid at all.  Given her description that her symptoms are improving it is difficult for her to ascertain whether the pain that she is having in her labia is similar or related to the pain that she was having in her anus previously.  She denies any pain with defecation, no blood in her stool, she denies any significant constipation.  Denies any vaginal or anal discharge.  She denies any vaginal bleeding or discharge.  She denies any urinary dysuria, frequency or urgency.  She also denies hematuria.  She denies any fevers but does endorse some chills which she attributes to the severe pain.    History reviewed. No pertinent past medical history.  There are no problems to display for this patient.   History reviewed. No pertinent surgical history.   OB History   No obstetric history on file.     No family history on file.     Home Medications Prior to Admission medications   Not on File    Allergies    Patient has no allergy information on record.  Review of Systems   Review of Systems  Constitutional: Negative for chills and fever.  HENT: Negative for congestion.   Eyes: Negative for pain.  Respiratory: Negative for cough and shortness of breath.   Cardiovascular: Negative for chest pain  and leg swelling.  Gastrointestinal: Positive for rectal pain (Now resolved). Negative for abdominal pain and vomiting.  Genitourinary: Negative for dysuria.       Vaginal swelling and pain  Musculoskeletal: Negative for myalgias.  Skin: Negative for rash.  Neurological: Negative for dizziness and headaches.    Physical Exam Updated Vital Signs BP 114/62   Pulse 72   Temp (!) 97.2 F (36.2 C) (Oral)   Resp 16   SpO2 99%   Physical Exam Vitals and nursing note reviewed.  Constitutional:      General: She is in acute distress.     Appearance: Normal appearance. She is not ill-appearing.     Comments: Acutely uncomfortable appearing, nontoxic 35 year old female Pleasant, able to speak in full sentences and explain her symptoms.  HENT:     Head: Normocephalic and atraumatic.     Mouth/Throat:     Mouth: Mucous membranes are moist.  Eyes:     General: No scleral icterus.       Right eye: No discharge.        Left eye: No discharge.     Conjunctiva/sclera: Conjunctivae normal.  Pulmonary:     Effort: Pulmonary effort is normal.     Breath sounds: No stridor.  Abdominal:     Palpations: Abdomen is soft.     Tenderness: There is no abdominal tenderness.  Genitourinary:  Comments: Rectal exam with what appears to be several small hemorrhoids.  No obvious rectal bleeding.  No tenderness to palpation of the anus.  There is an area of tenderness and induration that is noted on picture graph above.  Pelvic is otherwise without lesions. Vaginal canal without abnormal discharge or lesion Cervix appears normal, is closed No adnexal tenderness or CMT Skin:    General: Skin is warm and dry.  Neurological:     Mental Status: She is alert and oriented to person, place, and time. Mental status is at baseline.     ED Results / Procedures / Treatments   Labs (all labs ordered are listed, but only abnormal results are displayed) Labs Reviewed  WET PREP, GENITAL - Abnormal;  Notable for the following components:      Result Value   WBC, Wet Prep HPF POC MANY (*)    All other components within normal limits  CULTURE, BLOOD (ROUTINE X 2)  CULTURE, BLOOD (ROUTINE X 2)  URINALYSIS, ROUTINE W REFLEX MICROSCOPIC  CBC WITH DIFFERENTIAL/PLATELET  BASIC METABOLIC PANEL  RPR  LACTIC ACID, PLASMA  LACTIC ACID, PLASMA  HIV ANTIBODY (ROUTINE TESTING W REFLEX)  RAPID HIV SCREEN (HIV 1/2 AB+AG)  GC/CHLAMYDIA PROBE AMP ([place]) NOT AT [facility]    EKG None  Radiology No results found.  Procedures .Marland KitchenIncision and Drainage  Date/Time: 07/26/2020 4:32 PM Performed by: Gailen Shelter, PA Authorized by: Gailen Shelter, PA   Consent:    Consent obtained:  Verbal   Consent given by:  Patient   Risks discussed:  Bleeding, incomplete drainage, pain and damage to other organs   Alternatives discussed:  No treatment Universal protocol:    Procedure explained and questions answered to patient or proxy's satisfaction: yes     Relevant documents present and verified: yes     Test results available : yes     Imaging studies available: yes     Required blood products, implants, devices, and special equipment available: yes     Site/side marked: yes     Immediately prior to procedure, a time out was called: yes     Patient identity confirmed:  Verbally with patient Location:    Type:  Abscess   Size:  1cm   Location:  Anogenital   Anogenital location:  Vulva Pre-procedure details:    Skin preparation:  Betadine Anesthesia:    Anesthesia method:  Local infiltration   Local anesthetic:  Lidocaine 1% WITH epi Procedure type:    Complexity:  Simple Procedure details:    Needle aspiration: yes     Needle size:  18 G   Incision depth:  Subcutaneous   Drainage:  Bloody   Drainage amount:  Scant Post-procedure details:    Procedure completion:  Tolerated well, no immediate complications Comments:     Ultrasound-guided needle aspiration was attempted.  No  significant discharge was aspirated apart from some small amount of blood.  Patient tolerated procedure well given the successful analgesia in the form of lidocaine.     Medications Ordered in ED Medications  lidocaine-EPINEPHrine (XYLOCAINE W/EPI) 1 %-1:100000 (with pres) injection 20 mL (has no administration in time range)  oxyCODONE-acetaminophen (PERCOCET/ROXICET) 5-325 MG per tablet 1 tablet (1 tablet Oral Given 07/26/20 1403)  ibuprofen (ADVIL) tablet 400 mg (400 mg Oral Given 07/26/20 1403)  HYDROmorphone (DILAUDID) injection 0.5 mg (0.5 mg Intravenous Given 07/26/20 1549)  piperacillin-tazobactam (ZOSYN) IVPB 3.375 g (3.375 g Intravenous New Bag/Given 07/26/20 1549)  sodium  chloride 0.9 % bolus 1,000 mL (1,000 mLs Intravenous New Bag/Given 07/26/20 1549)    ED Course  I have reviewed the triage vital signs and the nursing notes.  Pertinent labs & imaging results that were available during my care of the patient were reviewed by me and considered in my medical decision making (see chart for details).    MDM Rules/Calculators/A&P                          Patient is a 35 year old female presented today with lump in right labia.  Because of induration and significant pain I used ultrasound at bedside to evaluate this area which appears to have a small abscess versus cellulitic area.  Needle aspiration was conducted but failed to aspirate any purulent fluid.  I discussed this case with my attending physician who cosigned this note including patient's presenting symptoms, physical exam, and planned diagnostics and interventions. Attending physician stated agreement with plan or made changes to plan which were implemented.   Attending physician assessed patient at bedside.  Given extensive area of induration will obtain CT abdomen pelvis along with blood cultures, basic lab work, STD testing and begin empiric antibiotics.  Patient care transferred to oncoming team 4:35 PM for her  follow-up on imaging and lab work.  If cellulitis versus abscess patient may benefit from discharge home with antibiotics versus further intervention.  Final Clinical Impression(s) / ED Diagnoses Final diagnoses:  Vulvar cellulitis    Rx / DC Orders ED Discharge Orders    None       Gailen Shelter, [place] 07/26/20 1635    Arby Barrette, MD 07/27/20 [phone]

## 2020-07-27 ENCOUNTER — Encounter (HOSPITAL_COMMUNITY): Payer: Self-pay | Admitting: *Deleted

## 2020-07-27 ENCOUNTER — Encounter (HOSPITAL_COMMUNITY): Payer: Self-pay

## 2020-07-27 LAB — RAPID HIV SCREEN (HIV 1/2 AB+AG)
HIV 1/2 Antibodies: NONREACTIVE
HIV-1 P24 Antigen - HIV24: NONREACTIVE

## 2020-07-27 LAB — GC/CHLAMYDIA PROBE AMP (~~LOC~~) NOT AT ARMC
Chlamydia: NEGATIVE
Comment: NEGATIVE
Comment: NORMAL
Neisseria Gonorrhea: NEGATIVE

## 2020-07-27 LAB — RPR: RPR Ser Ql: NONREACTIVE

## 2020-07-27 MED ORDER — AMOXICILLIN-POT CLAVULANATE 875-125 MG PO TABS: 1.0000 | ORAL_TABLET | Freq: Two times a day (BID) | ORAL | 0 refills | Status: DC

## 2020-07-27 MED ORDER — DOCUSATE SODIUM 100 MG PO CAPS: 100.0000 mg | ORAL_CAPSULE | Freq: Two times a day (BID) | ORAL | 0 refills | Status: DC | PRN

## 2020-07-27 MED ORDER — TRAMADOL HCL 50 MG PO TABS: 50.0000 mg | ORAL_TABLET | Freq: Four times a day (QID) | ORAL | 0 refills | Status: DC | PRN

## 2020-07-27 MED ORDER — POLYETHYLENE GLYCOL 3350 17 G PO PACK: 17.0000 g | PACK | Freq: Every day | ORAL | 0 refills | Status: DC | PRN

## 2020-07-27 NOTE — Anesthesia Postprocedure Evaluation (Signed)
Anesthesia Post Note  Patient: Sabrina Mullins  Procedure(s) Performed: IRRIGATION AND DEBRIDEMENT right labial ABSCESS (Right )     Patient location during evaluation: PACU Anesthesia Type: General Level of consciousness: awake and alert Pain management: pain level controlled Vital Signs Assessment: post-procedure vital signs reviewed and stable Respiratory status: spontaneous breathing, nonlabored ventilation, respiratory function stable and patient connected to nasal cannula oxygen Cardiovascular status: blood pressure returned to baseline and stable Postop Assessment: no apparent nausea or vomiting Anesthetic complications: no   No complications documented.  Last Vitals:  Vitals:   07/27/20 0543 07/27/20 0835  BP: (!) 96/59 99/64  Pulse: 63   Resp: 18   Temp: 36.8 C   SpO2: 99%     Last Pain:  Vitals:   07/27/20 0843  TempSrc:   PainSc: 5                  Latash Nouri

## 2020-07-27 NOTE — Discharge Summary (Signed)
Patient ID: Sabrina Mullins [id number] 12/28/1985 35 y.o.  Admit date: 07/26/2020 Discharge date: 07/27/2020  Admitting Diagnosis: Abscess of the right labium/perineum vs a perianal abscess  Discharge Diagnosis Patient Active Problem List   Diagnosis Date Noted  . Labial abscess 07/26/2020  . Abscess 07/26/2020    Consultants None   Reason for Admission: Sabrina Mullins is a 35 yo female who has been having pain on her right labia and perianal area for the last 6 days. She has not had any fevers, chills, or drainage. She thought she had hemorrhoids and was seen by GI, who referred her to our clinic today. She was seen by Dr. Cliffton Asters in clinic this morning, and there was induration with tenderness of the right posterior labia but no fluctuance or obvious abscess. She was sent to the ED for imaging. A CT scan showed a 5cm rim-enhancing fluid collection in the right perineum, tracking toward the anus. Labs are significant for a WBC of 13. Attempts were made in the ED to aspirate and drain the abscess but were not successful.  Procedures Dr. Freida Busman - Incision and drainage of right labial abscess - 07/26/2020  Hospital Course:  Patient was admitted to the general surgery team after above workup and was taken to the OR by Dr. Freida Busman on 3/23 where she underwent I&D of right labial abscess. Patient tolerated the procedure well. On POD 1 packing was removed, the patient was voiding well, tolerating diet, ambulating well, pain well controlled, vital signs stable, wound did not require any further surgical drainage and patient was felt stable for discharge home. A note was provided for work. Patient was d/c'd on abx. Follow up as noted below. Return precautions discussed.   Physical Exam: Gen:  Alert, NAD, pleasant HEENT: EOM's intact, pupils equal and round Card:  RRR, no M/G/R heard Pulm:  CTAB, no W/R/R, effort normal Abd: Soft, NT/ND, +BS GU: Chaperone, RN - [employer], present. Right labial  incision with packing present that was removed. There was some bloody/purulent material on dressing. Surrounding the wound there was a small amount of induration of the right labia that did not track towards the perineum. No erythema or fluctuance. Packing was not replaced.  Ext:  No LE edema  Psych: A&Ox3  Skin: no rashes noted, warm and dry  Allergies as of 07/27/2020   No Known Allergies     Medication List    TAKE these medications   acetaminophen 500 MG tablet Commonly known as: TYLENOL Take 500 mg by mouth every 6 (six) hours as needed for mild pain.   amoxicillin-clavulanate 875-125 MG tablet Commonly known as: AUGMENTIN Take 1 tablet by mouth every 12 (twelve) hours.   docusate sodium 100 MG capsule Commonly known as: COLACE Take 1 capsule (100 mg total) by mouth 2 (two) times daily as needed for mild constipation.   ibuprofen 200 MG tablet Commonly known as: ADVIL Take 200 mg by mouth every 6 (six) hours as needed for mild pain.   multivitamin with minerals Tabs tablet Take 1 tablet by mouth daily.   omega-3 acid ethyl esters 1 g capsule Commonly known as: LOVAZA Take 1 g by mouth daily.   polyethylene glycol 17 g packet Commonly known as: MiraLax Take 17 g by mouth daily as needed for mild constipation.   traMADol 50 MG tablet Commonly known as: ULTRAM Take 1 tablet (50 mg total) by mouth every 6 (six) hours as needed (breakthrough pain).  Follow-up Information    Surgery, [facility] [place]. Go on 08/15/2020.   Specialty: General Surgery Why: 1145am. Please arrive 30 minutes prior to your appointment for paperwork. Please bring a copy of your photo ID and insurance card.  Contact information: [address] [place] [address] [phone]        [facility] OUT[facility]IENT [facility] Follow up.   Contact information: [address], [address] [id number] mc [place] [phone]               Signed: Leary Roca, [facility] 07/27/2020, 9:16 AM Please see Amion for pager number during day hours 7:00am-4:30pm

## 2020-07-27 NOTE — Discharge Instructions (Signed)
Incision and Drainage, Care After This sheet gives you information about how to care for yourself after your procedure. Your health care provider may also give you more specific instructions. If you have problems or questions, contact your health care provider. What can I expect after the procedure? After the procedure, it is common to have:  Pain or discomfort around the incision site.  Blood, fluid, or pus (drainage) from the incision.  Redness and firm skin around the incision site. Follow these instructions at home: Medicines  Take over-the-counter and prescription medicines only as told by your health care provider.  If you were prescribed an antibiotic medicine, use or take it as told by your health care provider. Do not stop using the antibiotic even if you start to feel better. Wound care Follow instructions from your health care provider about how to take care of your wound. Make sure you:  Wash your hands with soap and water before and after you change your bandage (dressing). If soap and water are not available, use hand sanitizer.  Change your dressing and packing as told by your health care provider. ? If your dressing is dry or stuck when you try to remove it, moisten or wet the dressing with saline or water so that it can be removed without harming your skin or tissues. ? If your wound is packed, leave it in place until your health care provider tells you to remove it. To remove the packing, moisten or wet the packing with saline or water so that it can be removed without harming your skin or tissues.  Leave stitches (sutures), skin glue, or adhesive strips in place. These skin closures may need to stay in place for 2 weeks or longer. If adhesive strip edges start to loosen and curl up, you may trim the loose edges. Do not remove adhesive strips completely unless your health care provider tells you to do that. Check your wound every day for signs of infection. Check  for:  More redness, swelling, or pain.  More fluid or blood.  Warmth.  Pus or a bad smell. If you were sent home with a drain tube in place, follow instructions from your health care provider about:  How to empty it.  How to care for it at home.   General instructions  Rest the affected area.  Do not take baths, swim, or use a hot tub until your health care provider approves. Ask your health care provider if you may take showers. You may only be allowed to take sponge baths.  Return to your normal activities as told by your health care provider. Ask your health care provider what activities are safe for you. Your health care provider may put you on activity or lifting restrictions.  The incision will continue to drain. It is normal to have some clear or slightly bloody drainage. The amount of drainage should lessen each day.  Do not apply any creams, ointments, or liquids unless you have been told to by your health care provider.  Keep all follow-up visits as told by your health care provider. This is important. Contact a health care provider if:  Your cyst or abscess returns.  You have a fever or chills.  You have more redness, swelling, or pain around your incision.  You have more fluid or blood coming from your incision.  Your incision feels warm to the touch.  You have pus or a bad smell coming from your incision.  You have red   streaks above or below the incision site. Get help right away if:  You have severe pain or bleeding.  You cannot eat or drink without vomiting.  You have decreased urine output.  You become short of breath.  You have chest pain.  You cough up blood.  The affected area becomes numb or starts to tingle. These symptoms may represent a serious problem that is an emergency. Do not wait to see if the symptoms will go away. Get medical help right away. Call your local emergency services (911 in the [place] Do not drive yourself to the  hospital. Summary  After this procedure, it is common to have fluid, blood, or pus coming from the surgery site.  Follow all home care instructions. You will be told how to take care of your incision, how to check for infection, and how to take medicines.  If you were prescribed an antibiotic medicine, take it as told by your health care provider. Do not stop taking the antibiotic even if you start to feel better.  Contact a health care provider if you have increased redness, swelling, or pain around your incision. Get help right away if you have chest pain, you vomit, you cough up blood, or you have shortness of breath.  Keep all follow-up visits as told by your health care provider. This is important. This information is not intended to replace advice given to you by your health care provider. Make sure you discuss any questions you have with your health care provider. Document Revised: 03/23/2018 Document Reviewed: 03/23/2018 Elsevier Patient Education  2021 [employer]  

## 2020-07-27 NOTE — Plan of Care (Signed)
  Problem: Education: Goal: Knowledge of General Education information will improve Description Including pain rating scale, medication(s)/side effects and non-pharmacologic comfort measures Outcome: Progressing   

## 2020-07-27 NOTE — Progress Notes (Signed)
Sabrina Mullins to be D/C'd per MD order. Discussed with the patient and all questions fully answered. ? VSS, Skin clean, dry and intact without evidence of skin break down, no evidence of skin tears noted. ? IV catheter discontinued intact. Site without signs and symptoms of complications. Dressing and pressure applied. ? An After Visit Summary was printed and given to the patient. Patient informed where to pickup prescriptions. ? D/c education completed with patient/family including follow up instructions, medication list, d/c activities limitations if indicated, with other d/c instructions as indicated by MD - patient able to verbalize understanding, all questions fully answered.  ? Patient instructed to return to ED, call 911, or call MD for any changes in condition.  ? Patient to be escorted via WC, and D/C home via private auto.

## 2020-07-28 ENCOUNTER — Encounter: Payer: Self-pay | Admitting: Family Medicine

## 2020-07-28 ENCOUNTER — Other Ambulatory Visit: Payer: Self-pay

## 2020-07-28 ENCOUNTER — Ambulatory Visit (INDEPENDENT_AMBULATORY_CARE_PROVIDER_SITE_OTHER): Payer: 59 | Admitting: Family Medicine

## 2020-07-28 VITALS — BP 107/68 | HR 78 | Ht 65.25 in | Wt 143.2 lb

## 2020-07-28 DIAGNOSIS — Z833 Family history of diabetes mellitus: Secondary | ICD-10-CM

## 2020-07-28 DIAGNOSIS — Z8632 Personal history of gestational diabetes: Secondary | ICD-10-CM | POA: Diagnosis not present

## 2020-07-28 DIAGNOSIS — N764 Abscess of vulva: Secondary | ICD-10-CM

## 2020-07-28 DIAGNOSIS — Z0000 Encounter for general adult medical examination without abnormal findings: Secondary | ICD-10-CM

## 2020-07-28 DIAGNOSIS — K2100 Gastro-esophageal reflux disease with esophagitis, without bleeding: Secondary | ICD-10-CM

## 2020-07-28 DIAGNOSIS — Z1321 Encounter for screening for nutritional disorder: Secondary | ICD-10-CM | POA: Diagnosis not present

## 2020-07-28 NOTE — Progress Notes (Signed)
Office Visit Note   Patient: Sabrina Mullins           Date of Birth: 1985/10/29           MRN: [id number] Visit Date: 07/28/2020 Requested by: No referring provider defined for this encounter. PCP: Pcp, No  Subjective: Chief Complaint  Patient presents with  . establish primary care  . physical    HPI: She is here to establish care, and for a wellness exam.  3 days ago she had incision and drainage for a labial abscess.  She has never had problems like this before.  She thought it was hemorrhoids, but got significantly worse.  She is recovering fine, on antibiotics.  She is concerned that it could possibly happen again.  For the past 2 months she has had GERD symptoms.  She took omeprazole briefly and had some improvement, but when she stopped it the symptoms came right back.  Her sister had H. pylori and patient wants to be tested for that.  She has a history of gestational diabetes and a family history of diabetes in her parents.  She eats healthfully and tries to get some exercise.  No symptoms related to that.  She has had some skin troubles at the base of her head, in the hairline.  She is up-to-date on eye exams and dental exams.  Her last female exam was 4 years ago.  All of her prior Pap smears were normal.  She has not yet had mammogram imaging.  She works as an Airline pilot for a company.                ROS:   All other systems were reviewed and are negative.  Objective: Vital Signs: BP 107/68   Pulse 78   Ht 5' 5.25" (1.657 m)   Wt 143 lb 3.2 oz (65 kg)   BMI 23.65 kg/m   Physical Exam:  General:  Alert and oriented, in no acute distress. Pulm:  Breathing unlabored. Psy:  Normal mood, congruent affect. Skin: Slight erythema at the occipital area, no nodules.  No lymphadenopathy in her neck. HEENT:  Roe/AT, PERRLA, EOM Full, no nystagmus.  Funduscopic examination within normal limits.  No conjunctival erythema.  Tympanic membranes are pearly gray with normal  landmarks.  External ear canals are normal.  Nasal passages are clear.  Oropharynx is clear.  No significant lymphadenopathy.  No thyromegaly or nodules.  2+ carotid pulses without bruits. CV: Regular rate and rhythm without murmurs, rubs, or gallops.  No peripheral edema.  2+ radial and posterior tibial pulses. Lungs: Clear to auscultation throughout with no wheezing or areas of consolidation. Abd: Bowel sounds are active, no hepatosplenomegaly or masses.  Soft and nontender.  No audible bruits.  No evidence of ascites. Extremities: 2+ upper and lower DTRs.  No nail deformities.   Imaging: No results found.  Assessment & Plan: 1.  Wellness examination -Labs to evaluate.  She will see a gynecologist in 1 year for her next Pap smear.  Mammograms starting at age 16.  Self breast exams until then.  2.  Recent issues with GERD -H. pylori antibody testing.  We will treat as indicated. - Consider saccharomyces.  3.  Recovering from labial abscess incision and drainage  4.  History of gestational diabetes and family history of diabetes -Check A1c today. - Continue healthy lifestyle.     Procedures: No procedures performed        PMFS History: Patient Active Problem List  Diagnosis Date Noted  . History of gestational diabetes 07/28/2020  . Family history of diabetes mellitus 07/28/2020  . Labial abscess 07/26/2020  . Abscess 07/26/2020   No past medical history on file.  Family History  Problem Relation Age of Onset  . Healthy Mother   . Diabetes Father   . Cancer Maternal Grandmother   . Breast cancer Maternal Grandmother   . Cancer Maternal Grandfather   . Lung cancer Maternal Grandfather   . Healthy Paternal Grandmother   . Heart disease Paternal Grandmother   . Healthy Paternal Grandfather     Past Surgical History:  Procedure Laterality Date  . INCISION AND DRAINAGE PERIRECTAL ABSCESS Right 07/26/2020   Procedure: IRRIGATION AND DEBRIDEMENT right labial ABSCESS;   Surgeon: Fritzi Mandes, MD;  Location: [facility];  Service: General;  Laterality: Right;   Social History   Occupational History  . Occupation: Airline pilot  Tobacco Use  . Smoking status: Never Smoker  . Smokeless tobacco: Never Used  Substance and Sexual Activity  . Alcohol use: Not on file  . Drug use: Not on file  . Sexual activity: Not on file

## 2020-07-28 NOTE — Addendum Note (Signed)
Addended by: Lillia Carmel on: 07/28/2020 08:55 AM   Modules accepted: Orders

## 2020-07-28 NOTE — Addendum Note (Signed)
Addended by: Genevieve Ritzel J on: 07/28/2020 08:57 AM   Modules accepted: Orders  

## 2020-07-28 NOTE — Addendum Note (Signed)
Addended by: Lillia Carmel on: 07/28/2020 08:57 AM   Modules accepted: Orders

## 2020-07-31 ENCOUNTER — Telehealth: Payer: Self-pay | Admitting: Family Medicine

## 2020-07-31 LAB — LIPID PANEL
Cholesterol: 133 mg/dL (ref <200)
HDL: 37 mg/dL — ABNORMAL LOW (ref >=50)
LDL Cholesterol (Calc): 73 mg/dL (calc)
Non-HDL Cholesterol (Calc): 96 mg/dL (calc) (ref <130)
Total CHOL/HDL Ratio: 3.6 (calc) (ref <5.0)
Triglycerides: 147 mg/dL (ref <150)

## 2020-07-31 LAB — AEROBIC/ANAEROBIC CULTURE W GRAM STAIN (SURGICAL/DEEP WOUND)

## 2020-07-31 LAB — COMPREHENSIVE METABOLIC PANEL
AG Ratio: 1.8 (calc) (ref 1.0–2.5)
ALT: 11 U/L (ref 6–29)
AST: 12 U/L (ref 10–30)
Albumin: 4.3 g/dL (ref 3.6–5.1)
Alkaline phosphatase (APISO): 57 U/L (ref 31–125)
BUN: 11 mg/dL (ref 7–25)
CO2: 25 mmol/L (ref 20–32)
Calcium: 8.9 mg/dL (ref 8.6–10.2)
Chloride: 107 mmol/L (ref 98–110)
Creat: 0.7 mg/dL (ref 0.50–1.10)
Globulin: 2.4 g/dL (calc) (ref 1.9–3.7)
Glucose, Bld: 89 mg/dL (ref 65–139)
Potassium: 4.1 mmol/L (ref 3.5–5.3)
Sodium: 141 mmol/L (ref 135–146)
Total Bilirubin: 0.2 mg/dL (ref 0.2–1.2)
Total Protein: 6.7 g/dL (ref 6.1–8.1)

## 2020-07-31 LAB — CBC WITH DIFFERENTIAL/PLATELET
Absolute Monocytes: 462 cells/uL (ref 200–950)
Basophils Absolute: 28 cells/uL (ref 0–200)
Basophils Relative: 0.4 %
Eosinophils Absolute: 42 cells/uL (ref 15–500)
Eosinophils Relative: 0.6 %
HCT: 36.3 % (ref 35.0–45.0)
Hemoglobin: 12.4 g/dL (ref 11.7–15.5)
Lymphs Abs: 1981 cells/uL (ref 850–3900)
MCH: 30.5 pg (ref 27.0–33.0)
MCHC: 34.2 g/dL (ref 32.0–36.0)
MCV: 89.4 fL (ref 80.0–100.0)
MPV: 10.6 fL (ref 7.5–12.5)
Monocytes Relative: 6.6 %
Neutro Abs: 4487 cells/uL (ref 1500–7800)
Neutrophils Relative %: 64.1 %
Platelets: 230 10*3/uL (ref 140–400)
RBC: 4.06 10*6/uL (ref 3.80–5.10)
RDW: 11.7 % (ref 11.0–15.0)
Total Lymphocyte: 28.3 %
WBC: 7 10*3/uL (ref 3.8–10.8)

## 2020-07-31 LAB — HEMOGLOBIN A1C
Hgb A1c MFr Bld: 5.2 % of total Hgb (ref <5.7)
Mean Plasma Glucose: 103 mg/dL
eAG (mmol/L): 5.7 mmol/L

## 2020-07-31 LAB — CULTURE, BLOOD (ROUTINE X 2): Culture: NO GROWTH

## 2020-07-31 LAB — VITAMIN D 25 HYDROXY (VIT D DEFICIENCY, FRACTURES): Vit D, 25-Hydroxy: 30 ng/mL (ref 30–100)

## 2020-07-31 LAB — H. PYLORI BREATH TEST: H. pylori Breath Test: NOT DETECTED

## 2020-07-31 LAB — TSH: TSH: 4.39 mIU/L

## 2020-07-31 NOTE — Telephone Encounter (Signed)
The patient is now on MyChart. I pasted this information to a new message to the patient.

## 2020-07-31 NOTE — Telephone Encounter (Signed)
Labs are notable for the following:  H. Pylori test was negative.  No need for antibiotics for this, but I will email some information to consider.  Vitamin D is low-normal at 30.  We want this to be 50-80.  See recommendation below.  Thyroid TSH is in normal range at 4.39, but is higher than I would prefer at her age.  I would want this number to be closer to 0.5-1.0.  Here are some nutritional supplements to consider to improve thyroid function:  - Vitamin D:  2,000 IU daily  - Vitamin A:  2,000 IU daily  - Zinc:  20-30 mg daily  - Selenium:  200-400 mcg daily  - Iodine:  150 mcg daily  - Iron 15-20 mg daily (if deficient)   All other labs look good.

## 2020-08-01 LAB — CULTURE, BLOOD (ROUTINE X 2)
Culture: NO GROWTH
Special Requests: ADEQUATE

## 2020-08-08 ENCOUNTER — Other Ambulatory Visit: Payer: Self-pay | Admitting: Family Medicine

## 2020-08-08 MED ORDER — AMOXICILLIN-POT CLAVULANATE 875-125 MG PO TABS: 1.0000 | ORAL_TABLET | Freq: Two times a day (BID) | ORAL | 0 refills | Status: DC

## 2020-08-10 ENCOUNTER — Other Ambulatory Visit: Payer: Self-pay | Admitting: Student

## 2020-08-10 ENCOUNTER — Ambulatory Visit (HOSPITAL_COMMUNITY)
Admission: RE | Admit: 2020-08-10 | Discharge: 2020-08-10 | Disposition: A | Discharge status: Home / Self Care | Payer: 59 | Source: Ambulatory Visit | Attending: Student | Admitting: Student

## 2020-08-10 ENCOUNTER — Other Ambulatory Visit: Payer: Self-pay

## 2020-08-10 ENCOUNTER — Other Ambulatory Visit (HOSPITAL_COMMUNITY): Payer: Self-pay | Admitting: Student

## 2020-08-10 DIAGNOSIS — M8538 Osteitis condensans, other site: Secondary | ICD-10-CM | POA: Diagnosis not present

## 2020-08-10 DIAGNOSIS — R102 Pelvic and perineal pain: Secondary | ICD-10-CM

## 2020-08-10 DIAGNOSIS — L02214 Cutaneous abscess of groin: Secondary | ICD-10-CM | POA: Diagnosis not present

## 2020-08-10 IMAGING — CT CT PELVIS W/ CM
3 of 4 series · 11 of 46 positions shown, 18 images · IV contrast (APPLIED)
Comparison: CT abdomen and pelvis [DATE]

CLINICAL DATA: To abscesses at RIGHT groin and perineum, surgery on
[DATE], additional lancing today in office, continued pain

EXAM:
CT PELVIS WITH CONTRAST
TECHNIQUE: Multidetector CT imaging of the pelvis was performed using the
standard protocol following the bolus administration of intravenous
contrast. Sagittal and coronal MPR images reconstructed from axial
data set.
CONTRAST:  100mL OMNIPAQUE IOHEXOL 300 MG/ML SOLN IV. Dilute oral
contrast.

[Series 2: axial st · axial · 0.81mm/px · z∈[-511,-291]mm · 7 of 60 slices shown, 12 images]
[im 8/60  soft-tissue]
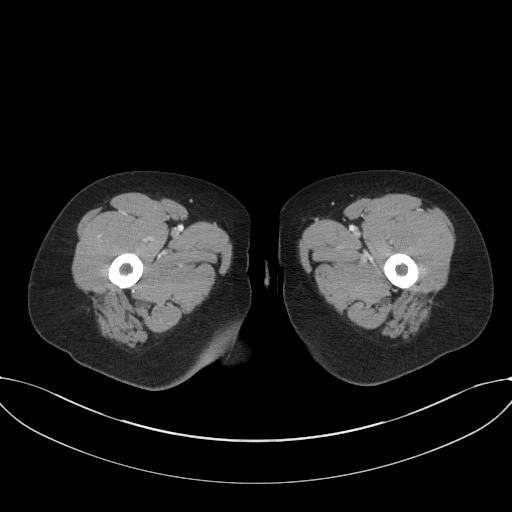
[im 8/60  bone]
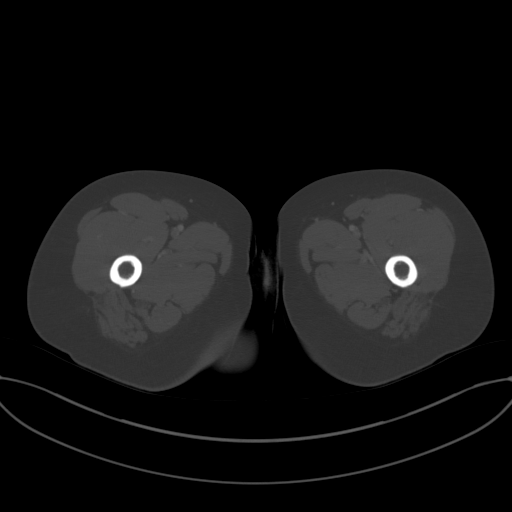
[im 15/60  soft-tissue]
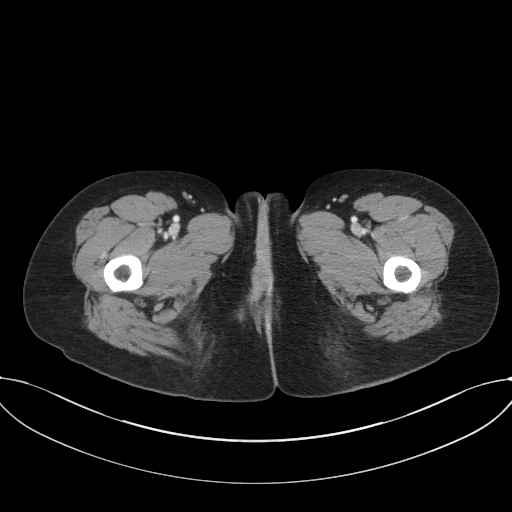
[im 23/60  soft-tissue]
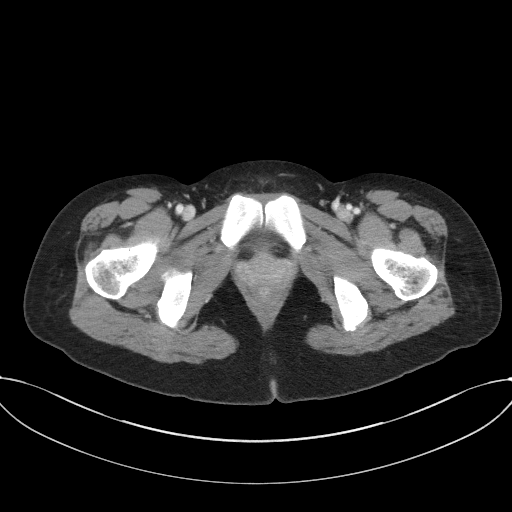
[im 30/60  soft-tissue]
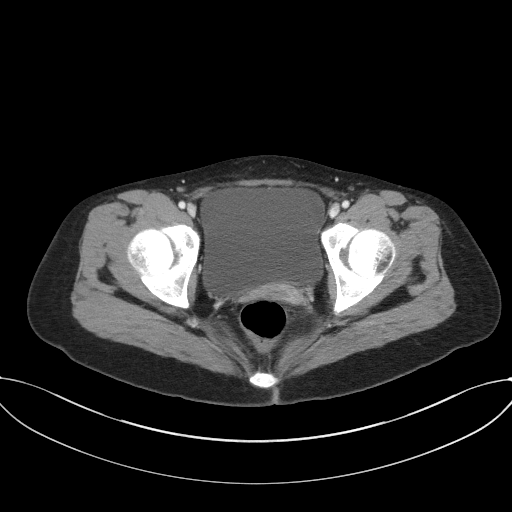
[im 30/60  lung]
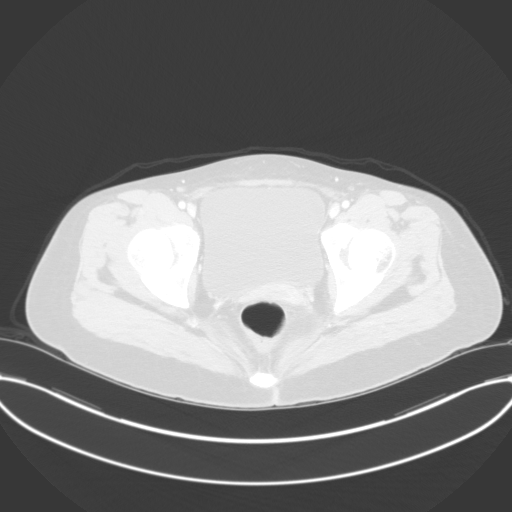
[im 37/60  soft-tissue]
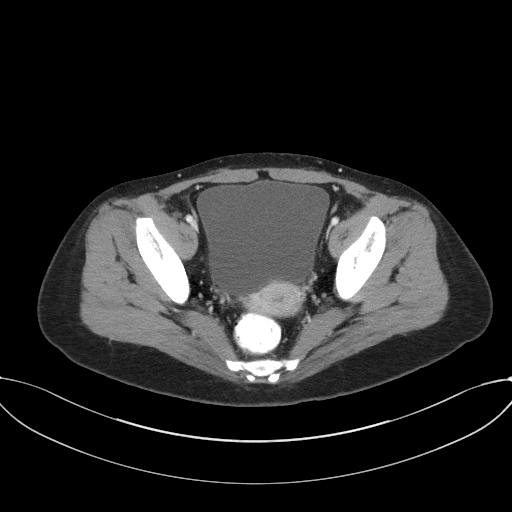
[im 37/60  lung]
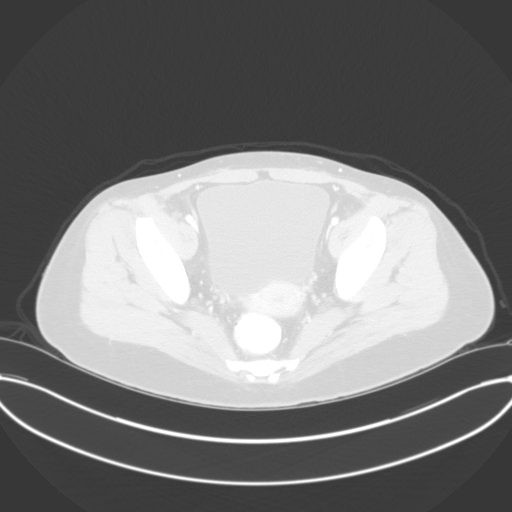
[im 45/60  soft-tissue]
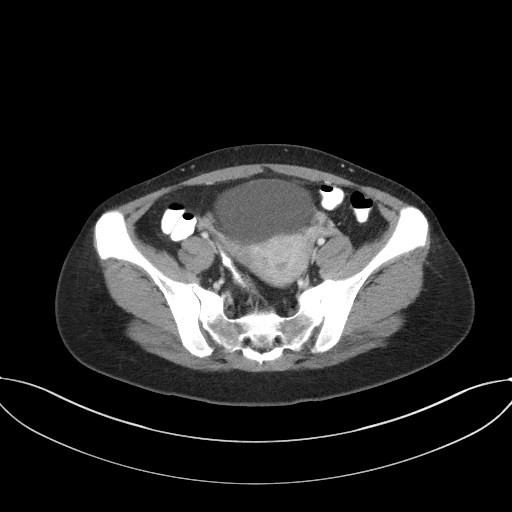
[im 45/60  lung]
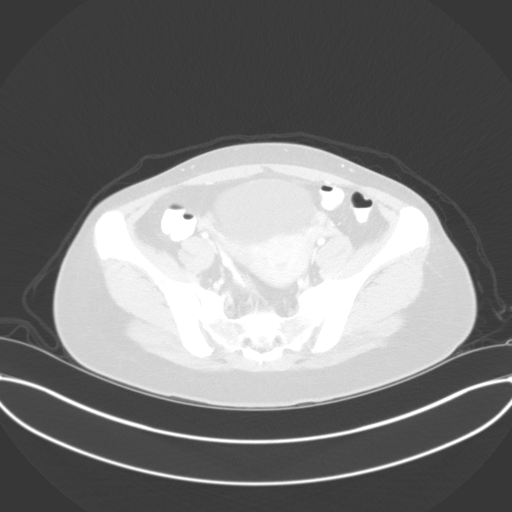
[im 52/60  soft-tissue]
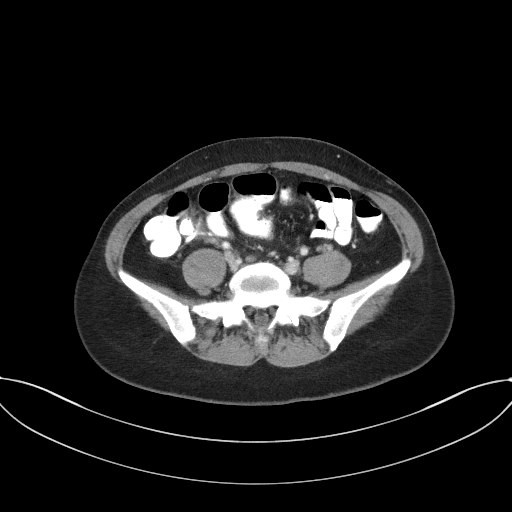
[im 52/60  lung]
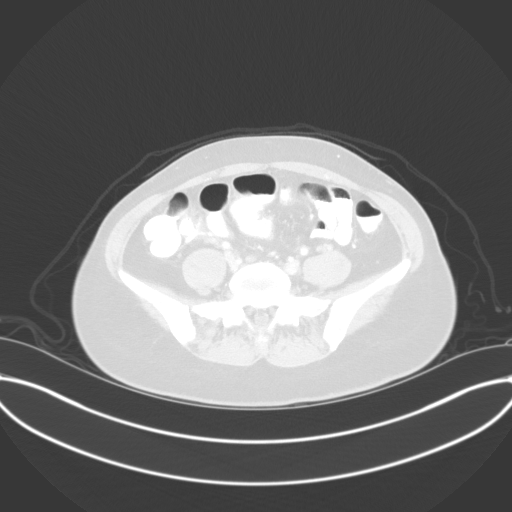

[Series 5: coronal st · coronal · 0.60mm/px · 3 of 78 slices shown, 4 images]
[im 26/78  soft-tissue]
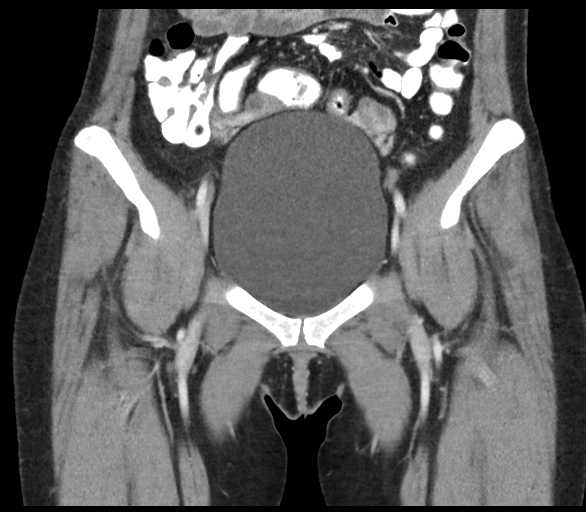
[im 35/78  soft-tissue]
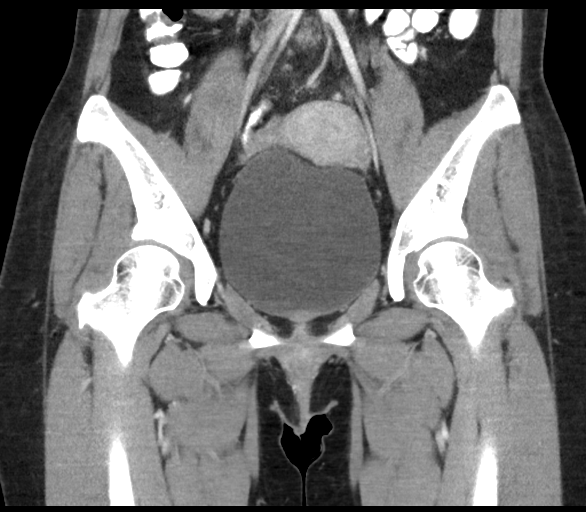
[im 35/78  bone]
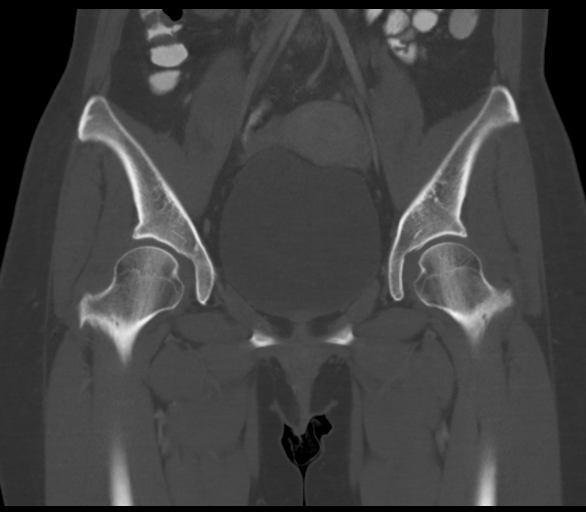
[im 43/78  soft-tissue]
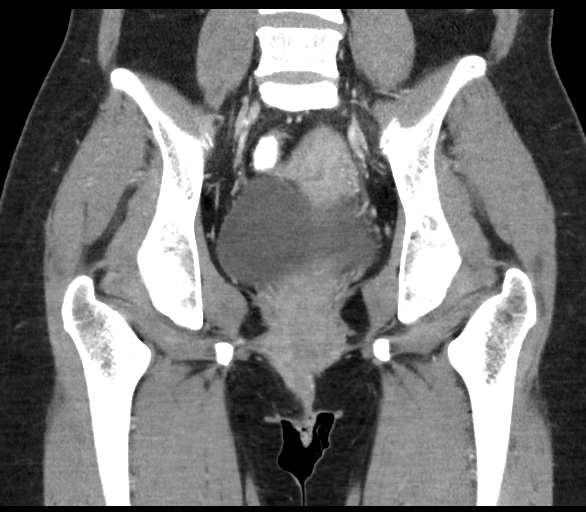

[Series 6: sagittal st · sagittal · 0.46mm/px · 1 of 117 slices shown, 2 images]
[im 39/117  soft-tissue]
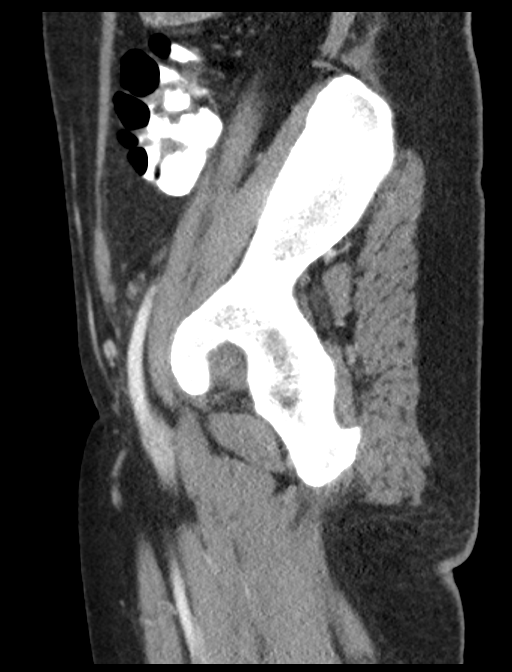
[im 39/117  bone]
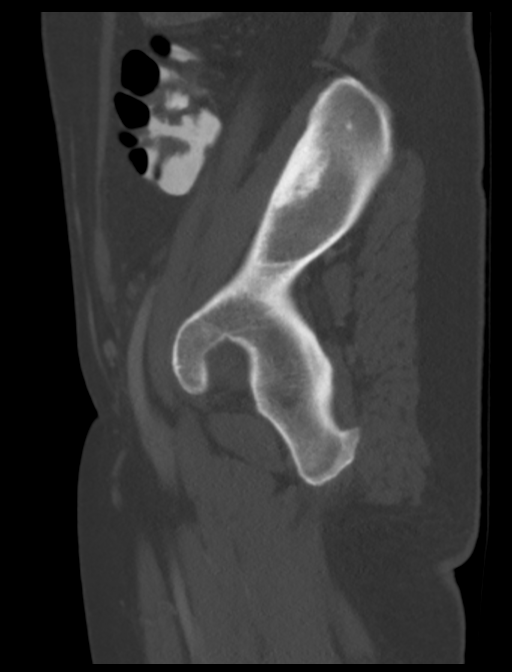

[11 of 46 positions shown; findings below may reference images not displayed]

FINDINGS: Urinary Tract:  Ureters and bladder normal appearance

Bowel: Normal appendix. Large and small bowel loops normal
appearance

Vascular/Lymphatic: Vascular structures patent.  No adenopathy.

Reproductive:  Unremarkable uterus and adnexa for age

Other: No free air or free fluid. No hernia. The 2 rim enhancing
fluid collections seen in the RIGHT perineum on the previous exam
are no longer identified, with only minimal residual soft tissue
infiltration at site. No new fluid collections.

Musculoskeletal: BILATERAL osteitis condensans CAMEIL.
IMPRESSION: Resolution of 2 perineal abscess collection seen on the previous
exam with minimal residual soft tissue infiltration at site.

No new abscess collections identified.

## 2020-08-10 MED ORDER — IOHEXOL 9 MG/ML PO SOLN
ORAL | Status: AC
  Administered 2020-08-10: 500 mL via ORAL
  Filled 2020-08-10: qty 1000

## 2020-08-10 MED ORDER — IOHEXOL 300 MG/ML  SOLN
100.0000 mL | Freq: Once | INTRAMUSCULAR | Status: AC | PRN
  Administered 2020-08-10: 100 mL via INTRAVENOUS

## 2020-08-10 MED ORDER — IOHEXOL 9 MG/ML PO SOLN
500.0000 mL | ORAL | Status: AC
Start: 2020-08-10 — End: 2020-08-10
  Administered 2020-08-10: 500 mL via ORAL

## 2020-09-13 ENCOUNTER — Encounter (HOSPITAL_COMMUNITY): Payer: Self-pay

## 2020-09-13 ENCOUNTER — Emergency Department (HOSPITAL_COMMUNITY): Payer: 59

## 2020-09-13 ENCOUNTER — Emergency Department (HOSPITAL_COMMUNITY)
Admission: EM | Admit: 2020-09-13 | Discharge: 2020-09-14 | Disposition: A | Discharge status: Home / Self Care | Payer: 59 | Attending: Emergency Medicine | Admitting: Emergency Medicine

## 2020-09-13 DIAGNOSIS — R102 Pelvic and perineal pain: Secondary | ICD-10-CM | POA: Diagnosis not present

## 2020-09-13 DIAGNOSIS — K6289 Other specified diseases of anus and rectum: Secondary | ICD-10-CM | POA: Diagnosis present

## 2020-09-13 DIAGNOSIS — K610 Anal abscess: Secondary | ICD-10-CM | POA: Diagnosis not present

## 2020-09-13 DIAGNOSIS — N949 Unspecified condition associated with female genital organs and menstrual cycle: Secondary | ICD-10-CM

## 2020-09-13 DIAGNOSIS — K611 Rectal abscess: Secondary | ICD-10-CM | POA: Diagnosis not present

## 2020-09-13 LAB — CBC WITH DIFFERENTIAL/PLATELET
Abs Immature Granulocytes: 0.01 10*3/uL (ref 0.00–0.07)
Basophils Absolute: 0 10*3/uL (ref 0.0–0.1)
Basophils Relative: 0 %
Eosinophils Absolute: 0.1 10*3/uL (ref 0.0–0.5)
Eosinophils Relative: 1 %
HCT: 39.9 % (ref 36.0–46.0)
Hemoglobin: 13.5 g/dL (ref 12.0–15.0)
Immature Granulocytes: 0 %
Lymphocytes Relative: 35 %
Lymphs Abs: 2.3 10*3/uL (ref 0.7–4.0)
MCH: 30.3 pg (ref 26.0–34.0)
MCHC: 33.8 g/dL (ref 30.0–36.0)
MCV: 89.5 fL (ref 80.0–100.0)
Monocytes Absolute: 0.4 10*3/uL (ref 0.1–1.0)
Monocytes Relative: 6 %
Neutro Abs: 3.8 10*3/uL (ref 1.7–7.7)
Neutrophils Relative %: 58 %
Platelets: 212 10*3/uL (ref 150–400)
RBC: 4.46 MIL/uL (ref 3.87–5.11)
RDW: 12.1 % (ref 11.5–15.5)
WBC: 6.7 10*3/uL (ref 4.0–10.5)
nRBC: 0 % (ref 0.0–0.2)

## 2020-09-13 LAB — BASIC METABOLIC PANEL
Anion gap: 6 (ref 5–15)
BUN: 9 mg/dL (ref 6–20)
CO2: 24 mmol/L (ref 22–32)
Calcium: 9 mg/dL (ref 8.9–10.3)
Chloride: 106 mmol/L (ref 98–111)
Creatinine, Ser: 0.67 mg/dL (ref 0.44–1.00)
GFR, Estimated: >60 mL/min (ref >60)
Glucose, Bld: 97 mg/dL (ref 70–99)
Potassium: 3.6 mmol/L (ref 3.5–5.1)
Sodium: 136 mmol/L (ref 135–145)

## 2020-09-13 LAB — I-STAT BETA HCG BLOOD, ED (MC, WL, AP ONLY): I-stat hCG, quantitative: <5 m[IU]/mL (ref <5)

## 2020-09-13 IMAGING — CT CT PELVIS W/O CM
2 of 5 series · 15 of 46 positions shown, 18 images · non-contrast
Comparison: Pelvic CT dated [DATE]. CT abdomen pelvis dated
[DATE].

CLINICAL DATA: 35-year-old female with perianal abscess.

EXAM:
CT PELVIS WITHOUT CONTRAST
TECHNIQUE: Multidetector CT imaging of the pelvis was performed following the
standard protocol without intravenous contrast.

[Series 3: soft tissue · axial · 0.78mm/px · z∈[+675,+920]mm · 12 of 55 slices shown, 15 images]
[im 4/55  soft-tissue]
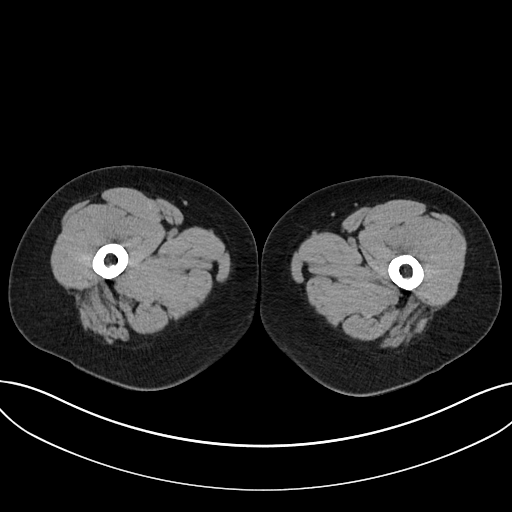
[im 4/55  bone]
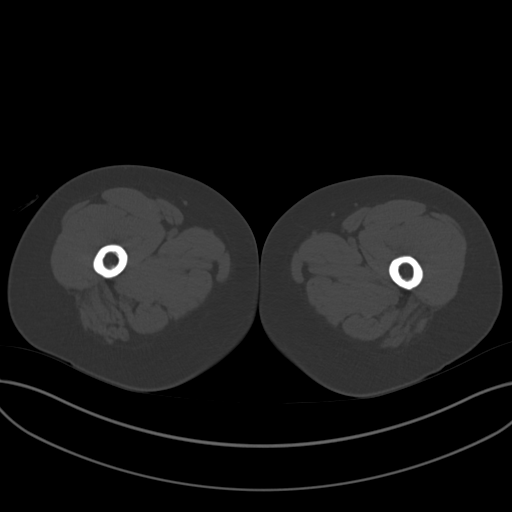
[im 11/55  soft-tissue]
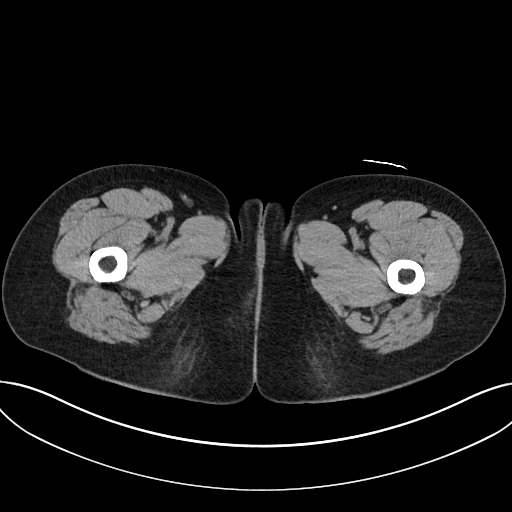
[im 16/55  soft-tissue]
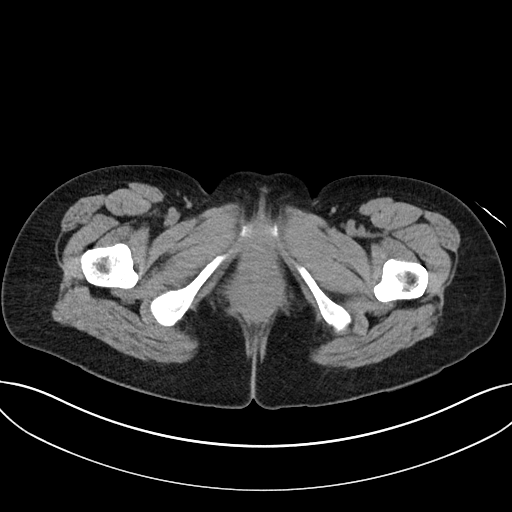
[im 21/55  soft-tissue]
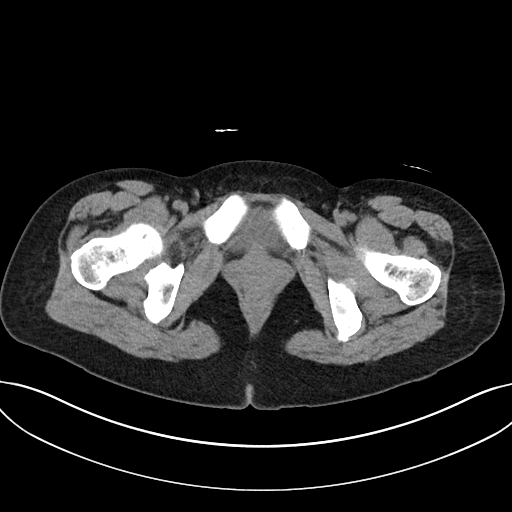
[im 28/55  soft-tissue]
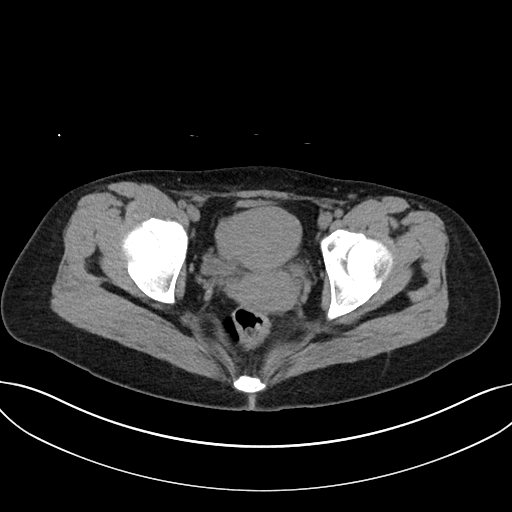
[im 34/55  soft-tissue]
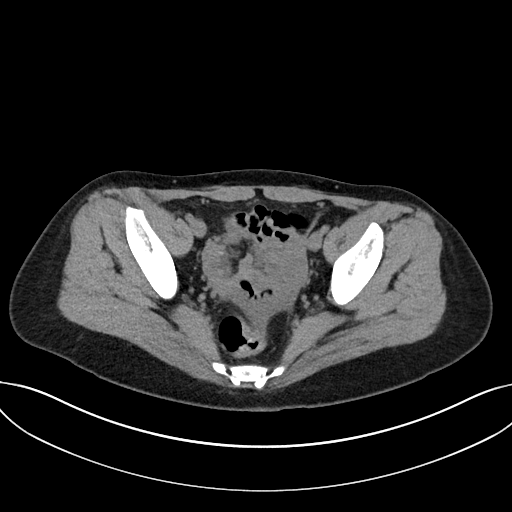
[im 39/55  soft-tissue]
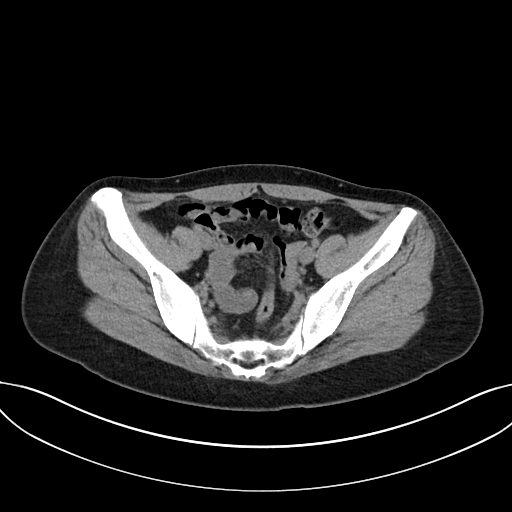
[im 46/55  soft-tissue]
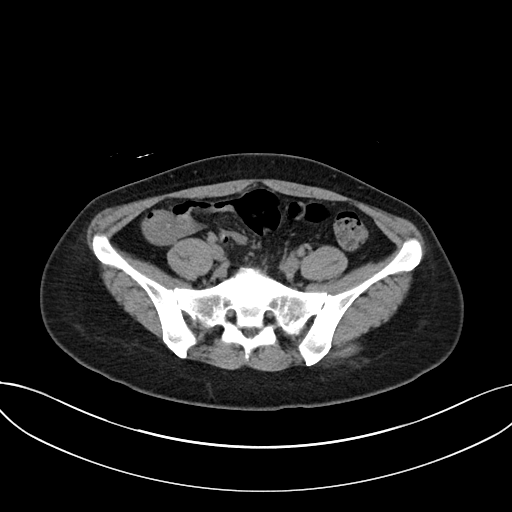
[im 48/55  lung]
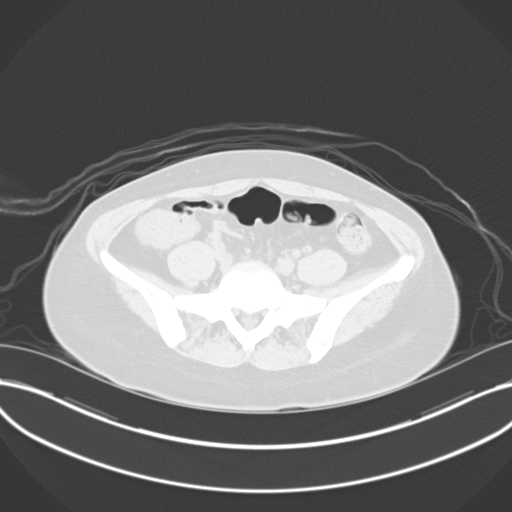
[im 49/55  lung]
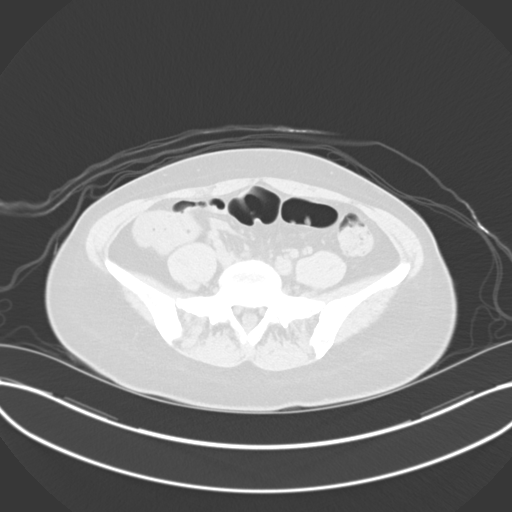
[im 51/55  soft-tissue]
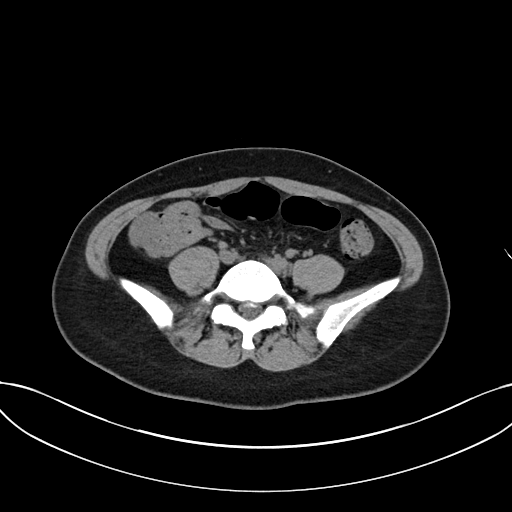
[im 51/55  lung]
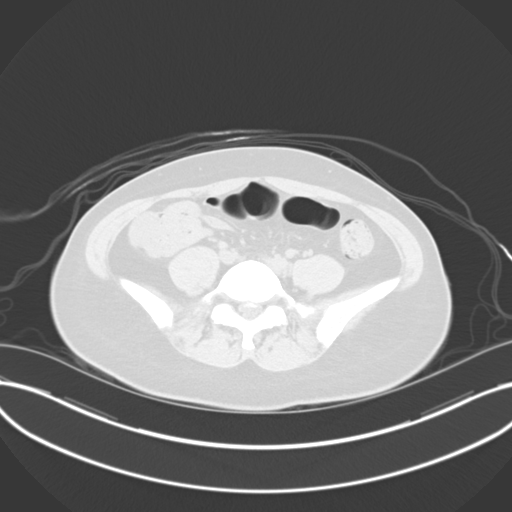
[im 51/55  bone]
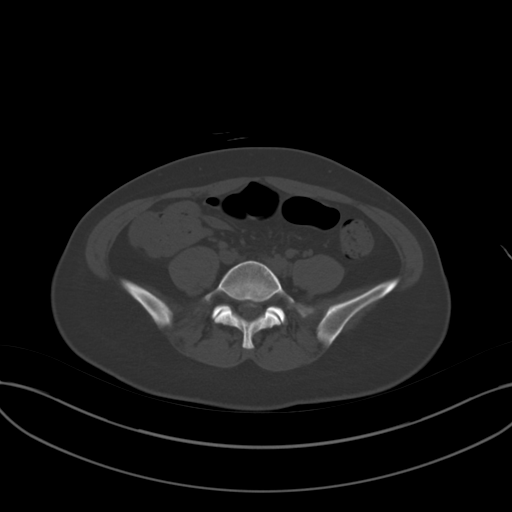
[im 53/55  lung]
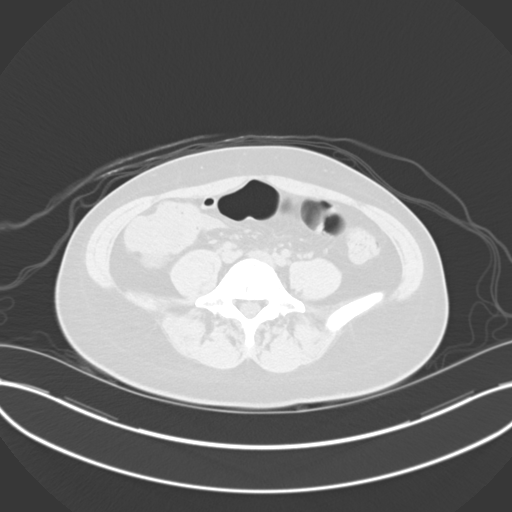

[Series 5: cor st · coronal · 0.55mm/px · 3 of 105 slices shown]
[im 27/105  soft-tissue]
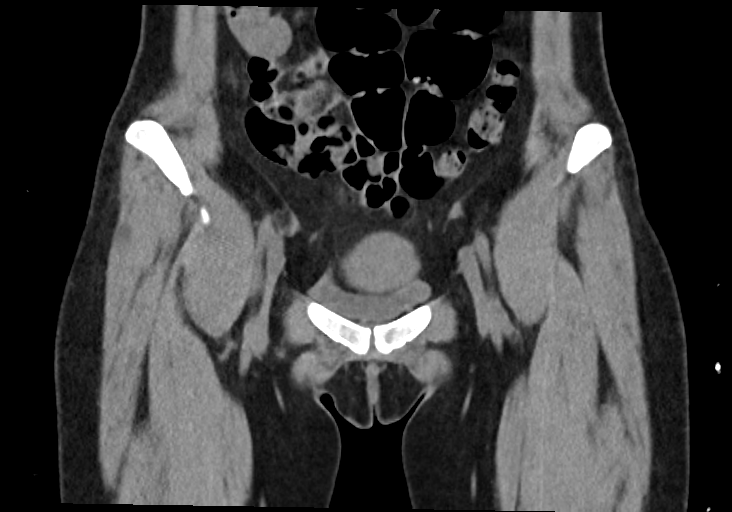
[im 53/105  soft-tissue]
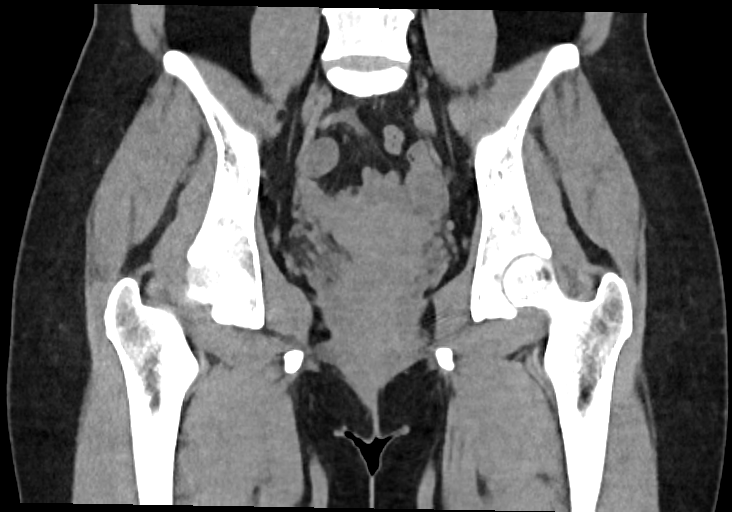
[im 79/105  soft-tissue]
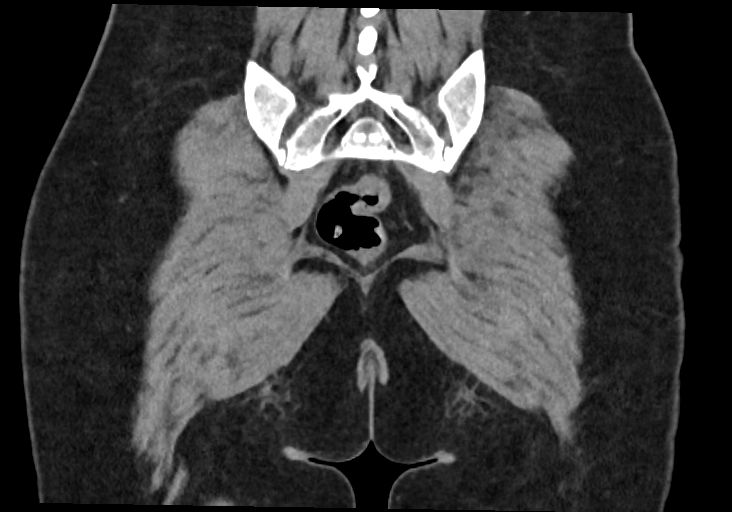

[15 of 46 positions shown; findings below may reference images not displayed]

FINDINGS: Evaluation of this exam is limited in the absence of intravenous
contrast.

Urinary Tract:  No abnormality visualized.

Bowel:  Unremarkable visualized pelvic bowel loops.

Vascular/Lymphatic: No pathologically enlarged lymph nodes. No
significant vascular abnormality seen.

Reproductive: The uterus is anteverted and grossly unremarkable. No
adnexal masses.

Other: No drainable fluid collection or abscess identified in the
perianal region. Nodular tissue in the right perianal region, likely
sequela of prior drainage or scarring.

Musculoskeletal: No acute osseous pathology. Bilateral sacroiliitis.
IMPRESSION: No acute pelvic pathology. No fluid collection or abscess.

## 2020-09-13 NOTE — ED Provider Notes (Signed)
[facility] EMERGENCY DEPARTMENT Provider Note   CSN: [id number] Arrival date & time: 09/13/20  1855     History Chief Complaint  Patient presents with  . Abscess    Sabrina Mullins is a 35 y.o. female presenting for evaluation of perirectal pain.   Pt states she had a labial/perirectal abscess on march, drained with general surgery. She followed up in the office 1 month later, and repeat CT which was negative, and started on abx. sxs resolved. 2 wks ago, sxs returned, and they have continued to worsen. She has increasing pain and drainage. She was at the surgery office this week, and there is a plan for repeat eval (?surgery) later this month. She was started on abx again (?amoxil). She reports sx continue to worsen and she is in a lot of pain. She is not taking anything else for pain control. No previous h/o abscesses. No other medical problems.   HPI     History reviewed. No pertinent past medical history.  Patient Active Problem List   Diagnosis Date Noted  . History of gestational diabetes 07/28/2020  . Family history of diabetes mellitus 07/28/2020  . Labial abscess 07/26/2020  . Abscess 07/26/2020    Past Surgical History:  Procedure Laterality Date  . INCISION AND DRAINAGE PERIRECTAL ABSCESS Right 07/26/2020   Procedure: IRRIGATION AND DEBRIDEMENT right labial ABSCESS;  Surgeon: Fritzi Mandes, MD;  Location: [facility] OR;  Service: General;  Laterality: Right;     OB History   No obstetric history on file.     Family History  Problem Relation Age of Onset  . Healthy Mother   . Diabetes Father   . Cancer Maternal Grandmother   . Breast cancer Maternal Grandmother   . Cancer Maternal Grandfather   . Lung cancer Maternal Grandfather   . Healthy Paternal Grandmother   . Heart disease Paternal Grandmother   . Healthy Paternal Grandfather     Social History   Tobacco Use  . Smoking status: Never Smoker  . Smokeless tobacco: Never Used    Home  Medications Prior to Admission medications   Medication Sig Start Date End Date Taking? Authorizing Provider  acetaminophen (TYLENOL) 500 MG tablet Take 500 mg by mouth every 6 (six) hours as needed for mild pain.    [provider]  amoxicillin-clavulanate (AUGMENTIN) 875-125 MG tablet Take 1 tablet by mouth 2 (two) times daily. 08/08/20   Hilts, Casimiro Needle, MD  docusate sodium (COLACE) 100 MG capsule Take 1 capsule (100 mg total) by mouth 2 (two) times daily as needed for mild constipation. 07/27/20   Maczis, Elmer Sow, PA-C  ibuprofen (ADVIL) 200 MG tablet Take 200 mg by mouth every 6 (six) hours as needed for mild pain.    [provider]  Multiple Vitamin (MULTIVITAMIN WITH MINERALS) TABS tablet Take 1 tablet by mouth daily.    [provider]  omega-3 acid ethyl esters (LOVAZA) 1 g capsule Take 1 g by mouth daily.    [provider]  omeprazole (PRILOSEC) 20 MG capsule Take 20 mg by mouth daily. 07/25/20   [provider]  polyethylene glycol (MIRALAX) 17 g packet Take 17 g by mouth daily as needed for mild constipation. 07/27/20   Maczis, Elmer Sow, PA-C  traMADol (ULTRAM) 50 MG tablet Take 1 tablet (50 mg total) by mouth every 6 (six) hours as needed (breakthrough pain). 07/27/20   Maczis, Elmer Sow, PA-C    Allergies    Patient has  no known allergies.  Review of Systems   Review of Systems  Gastrointestinal: Positive for rectal pain.  All other systems reviewed and are negative.   Physical Exam Updated Vital Signs BP 110/64 (BP Location: Right Arm)   Pulse 63   Temp 98.3 F (36.8 C) (Oral)   Resp 16   SpO2 95%   Physical Exam Vitals and nursing note reviewed. Exam conducted with a chaperone present.  Constitutional:      General: She is not in acute distress.    Appearance: She is well-developed.     Comments: Resting in the bed in NAD  HENT:     Head: Normocephalic and atraumatic.  Eyes:     Extraocular Movements: Extraocular  movements intact.     Conjunctiva/sclera: Conjunctivae normal.     Pupils: Pupils are equal, round, and reactive to light.  Cardiovascular:     Rate and Rhythm: Normal rate and regular rhythm.     Pulses: Normal pulses.  Pulmonary:     Effort: Pulmonary effort is normal. No respiratory distress.     Breath sounds: Normal breath sounds. No wheezing.  Abdominal:     General: There is no distension.     Palpations: Abdomen is soft. There is no mass.     Tenderness: There is no abdominal tenderness. There is no guarding or rebound.  Genitourinary:      CommentsAnise Salvo, RN present as chaperone.   Tenderness and induration of the perineum. No active drainage.   External hemorrhoid without tenderness or signs of thrombus.    Musculoskeletal:        General: Normal range of motion.     Cervical back: Normal range of motion and neck supple.  Skin:    General: Skin is warm and dry.     Capillary Refill: Capillary refill takes less than 2 seconds.  Neurological:     Mental Status: She is alert and oriented to person, place, and time.     ED Results / Procedures / Treatments   Labs (all labs ordered are listed, but only abnormal results are displayed) Labs Reviewed  BASIC METABOLIC PANEL  CBC WITH DIFFERENTIAL/PLATELET  I-STAT BETA HCG BLOOD, ED ([facility], [facility], [facility] ONLY)    EKG None  Radiology No results found.  Procedures Procedures   Medications Ordered in ED Medications - No data to display  ED Course  I have reviewed the triage vital signs and the nursing notes.  Pertinent labs & imaging results that were available during my care of the patient were reviewed by me and considered in my medical decision making (see chart for details).    MDM Rules/Calculators/A&P                          Patient presenting for evaluation of worsening perennial pain.  On exam, patient appears nontoxic.  Vital signs are stable.  Exam not consistent with sepsis.  Labs obtained from triage  interpreted by me, overall reassuring.  No leukocytosis.  GU exam does show an area of tenderness and induration.  In the setting of patient's recent history of abscesses in this area, will obtain CT for further evaluation.  Pt signed out to [employer], PA-C for f/u on CT. If negative, plan for tx with pain control and warm compresses. If abscess, plan for gen surgery consult.   Final Clinical Impression(s) / ED Diagnoses Final diagnoses:  None    Rx / DC Orders  ED Discharge Orders    None       Alveria Apley, PA-C 09/13/20 2345    Sabas Sous, MD 09/14/20 [phone]

## 2020-09-13 NOTE — ED Triage Notes (Signed)
Pt reports that she for the past 2 months she has had a abscess on her rectal area, has been draining, denies fevers.

## 2020-09-13 NOTE — ED Provider Notes (Signed)
Emergency Medicine Provider Triage Evaluation Note  Sabrina Mullins 35 y.o. female was evaluated in triage.  Pt complains of rectal pain.  She reports she was diagnosed with an abscess to the anal area.  She has scheduled follow-up in the next few weeks but states that the pain was getting worse and she noted some discharge from it.  No fevers, abdominal pain.   Review of Systems  Positive: Abscess, rectal pain Negative: Fevers, abdominal pain.  Physical Exam  BP 134/82   Pulse 70   Temp 98.2 F (36.8 C) (Oral)   Resp 18   Ht 5\' 4"  (1.626 m)   Wt 65.8 kg   SpO2 100%   BMI 24.89 kg/m  Gen:   Awake, no distress   HEENT:  Atraumatic  Resp:  Normal effort  Cardiac:  Normal rate  Abd:   Nondistended, nontender  MSK:   Moves extremities without difficulty  Neuro:  Speech clear   Other:   n/a   Medical Decision Making  Medically screening exam initiated at 7:04 PM  Appropriate orders placed.  was informed that the remainder of the evaluation will be completed by another provider, this initial triage assessment does not replace that evaluation, and the importance of remaining in the ED until their evaluation is complete.   Clinical Impression  Rectal pain, abscess.   Portions of this note were generated with Daune Perch. Dictation errors may occur despite best attempts at proofreading.     Scientist, clinical (histocompatibility and immunogenetics), PA-C 09/13/20 11/13/20, MD 09/13/20 2337

## 2020-09-13 NOTE — ED Provider Notes (Signed)
23:30: Assumed care of patient from Sophia Caccavale PA-C at change of shift pending CT & disposition.   Please see prior provider note for full H&P.  Briefly patient is a 35 yo female with a hx of prior labial/perirectal abscess requiring general surgery draining in March of this year, started to have increased discomfort 1-2 weeks ago with drainage, saw general surgery 05/09 and was started on abx, pain is worsening prompting ED visit.   Labs fairly unremarkable.  Care signed out to me @ change of shift pending CT imaging- plan @ sign out:  If negative CT imaging abscess plan for tx with pain control and warm compresses, if abscess on CT, plan for gen surgery consult.   CT pelvis WO contrast (current critical contrast dye shortage):  No acute pelvic pathology. No fluid collection or abscess.  Patient resting comfortably on re-assessment, nontoxic appearing. Updated on results. She has her prescription with her and it appears she was started on Augmentin with prescription 09/12/20- will have her continue this as she has not had a full 48 hours of abx. General surgery PA she saw has given prescription for norco which patient was afraid to fill due to addictive potential- educated further. Discussed warm compresses at home, will give naproxen to assist further with discomfort, general surgery follow up. I discussed results, treatment plan, need for follow-up, and return precautions with the patient. Provided opportunity for questions, patient confirmed understanding and is in agreement with plan.     Results for orders placed or performed during the hospital encounter of 09/13/20  Basic metabolic panel  Result Value Ref Range   Sodium 136 135 - 145 mmol/L   Potassium 3.6 3.5 - 5.1 mmol/L   Chloride 106 98 - 111 mmol/L   CO2 24 22 - 32 mmol/L   Glucose, Bld 97 70 - 99 mg/dL   BUN 9 6 - 20 mg/dL   Creatinine, Ser 6.16 0.44 - 1.00 mg/dL   Calcium 9.0 8.9 - 07.3 mg/dL   GFR, Estimated >71 >06  mL/min   Anion gap 6 5 - 15  CBC with Differential  Result Value Ref Range   WBC 6.7 4.0 - 10.5 K/uL   RBC 4.46 3.87 - 5.11 MIL/uL   Hemoglobin 13.5 12.0 - 15.0 g/dL   HCT 26.9 48.5 - 46.2 %   MCV 89.5 80.0 - 100.0 fL   MCH 30.3 26.0 - 34.0 pg   MCHC 33.8 30.0 - 36.0 g/dL   RDW 70.3 50.0 - 93.8 %   Platelets 212 150 - 400 K/uL   nRBC 0.0 0.0 - 0.2 %   Neutrophils Relative % 58 %   Neutro Abs 3.8 1.7 - 7.7 K/uL   Lymphocytes Relative 35 %   Lymphs Abs 2.3 0.7 - 4.0 K/uL   Monocytes Relative 6 %   Monocytes Absolute 0.4 0.1 - 1.0 K/uL   Eosinophils Relative 1 %   Eosinophils Absolute 0.1 0.0 - 0.5 K/uL   Basophils Relative 0 %   Basophils Absolute 0.0 0.0 - 0.1 K/uL   Immature Granulocytes 0 %   Abs Immature Granulocytes 0.01 0.00 - 0.07 K/uL  I-Stat beta hCG blood, ED  Result Value Ref Range   I-stat hCG, quantitative <5.0 <5 mIU/mL   Comment 3           CT PELVIS WO CONTRAST  Result Date: 09/14/2020 CLINICAL DATA:  35 year old female with perianal abscess. EXAM: CT PELVIS WITHOUT CONTRAST TECHNIQUE: Multidetector CT imaging of  the pelvis was performed following the standard protocol without intravenous contrast. COMPARISON:  Pelvic CT dated 08/10/2020. CT abdomen pelvis dated 07/26/2020. FINDINGS: Evaluation of this exam is limited in the absence of intravenous contrast. Urinary Tract:  No abnormality visualized. Bowel:  Unremarkable visualized pelvic bowel loops. Vascular/Lymphatic: No pathologically enlarged lymph nodes. No significant vascular abnormality seen. Reproductive: The uterus is anteverted and grossly unremarkable. No adnexal masses. Other: No drainable fluid collection or abscess identified in the perianal region. Nodular tissue in the right perianal region, likely sequela of prior drainage or scarring. Musculoskeletal: No acute osseous pathology. Bilateral sacroiliitis. IMPRESSION: No acute pelvic pathology. No fluid collection or abscess. Electronically Signed   By:  Elgie Collard M.D.   On: 09/14/2020 00:10         Cherly Anderson, PA-C 09/14/20 0046    Sabas Sous, MD 09/14/20 [phone]

## 2020-09-14 ENCOUNTER — Encounter (HOSPITAL_COMMUNITY): Payer: Self-pay | Admitting: Student

## 2020-09-14 MED ORDER — NAPROXEN 500 MG PO TABS: 500.0000 mg | ORAL_TABLET | Freq: Two times a day (BID) | ORAL | 0 refills | Status: DC | PRN

## 2020-09-14 NOTE — Discharge Instructions (Addendum)
You were seen in the ER tonight pain to your vaginal/rectal area. Your CT scan was overall reassuring.   Please continue to take the Augmentin (antibiotic) as prescribed.   Please apply warm compresses to the area for 15 minutes every 2 hours to help with discomfort.   We are sending you home with a prescription for naproxen for pain.   - Naproxen is a nonsteroidal anti-inflammatory medication that will help with pain and swelling. Be sure to take this medication as prescribed with food, 1 pill every 12 hours,  It should be taken with food, as it can cause stomach upset, and more seriously, stomach bleeding. Do not take other nonsteroidal anti-inflammatory medications with this such as Advil, Motrin, Aleve, Mobic, Goodie Powder, or Motrin.    We have prescribed you new medication(s) today. Discuss the medications prescribed today with your pharmacist as they can have adverse effects and interactions with your other medicines including over the counter and prescribed medications. Seek medical evaluation if you start to experience new or abnormal symptoms after taking one of these medicines, seek care immediately if you start to experience difficulty breathing, feeling of your throat closing, facial swelling, or rash as these could be indications of a more serious allergic reaction  Please call general surgery first thing tomorrow to schedule soonest available follow up appointment.  Return to the ER for new or worsening symptoms including but not limited to new or worsening pain, fever, inability to keep fluids down, increased drainage from the area, passing out, trouble having a bowel movement, abdominal pain, or any other concerns.

## 2020-09-18 DIAGNOSIS — R102 Pelvic and perineal pain: Secondary | ICD-10-CM | POA: Diagnosis not present

## 2020-09-19 ENCOUNTER — Other Ambulatory Visit: Payer: Self-pay | Admitting: Surgery

## 2020-09-19 ENCOUNTER — Other Ambulatory Visit (HOSPITAL_COMMUNITY): Payer: Self-pay | Admitting: Surgery

## 2020-09-19 DIAGNOSIS — Z5189 Encounter for other specified aftercare: Secondary | ICD-10-CM

## 2020-09-19 DIAGNOSIS — N949 Unspecified condition associated with female genital organs and menstrual cycle: Secondary | ICD-10-CM

## 2020-09-19 DIAGNOSIS — R102 Pelvic and perineal pain: Secondary | ICD-10-CM

## 2020-09-20 ENCOUNTER — Encounter: Payer: Self-pay | Admitting: Family Medicine

## 2020-09-20 DIAGNOSIS — Z0000 Encounter for general adult medical examination without abnormal findings: Secondary | ICD-10-CM

## 2020-09-20 NOTE — Addendum Note (Signed)
Addended by: Lillia Carmel on: 09/20/2020 04:56 PM   Modules accepted: Orders

## 2020-09-22 ENCOUNTER — Other Ambulatory Visit: Payer: Self-pay

## 2020-09-22 ENCOUNTER — Ambulatory Visit (HOSPITAL_COMMUNITY)
Admission: RE | Admit: 2020-09-22 | Discharge: 2020-09-22 | Disposition: A | Discharge status: Home / Self Care | Payer: 59 | Source: Ambulatory Visit | Attending: Surgery | Admitting: Surgery

## 2020-09-22 ENCOUNTER — Ambulatory Visit (INDEPENDENT_AMBULATORY_CARE_PROVIDER_SITE_OTHER): Payer: 59

## 2020-09-22 DIAGNOSIS — L0501 Pilonidal cyst with abscess: Secondary | ICD-10-CM | POA: Diagnosis not present

## 2020-09-22 DIAGNOSIS — N949 Unspecified condition associated with female genital organs and menstrual cycle: Secondary | ICD-10-CM | POA: Insufficient documentation

## 2020-09-22 DIAGNOSIS — Z5189 Encounter for other specified aftercare: Secondary | ICD-10-CM

## 2020-09-22 DIAGNOSIS — Z0000 Encounter for general adult medical examination without abnormal findings: Secondary | ICD-10-CM | POA: Diagnosis not present

## 2020-09-22 DIAGNOSIS — R102 Pelvic and perineal pain: Secondary | ICD-10-CM

## 2020-09-22 DIAGNOSIS — Z0184 Encounter for antibody response examination: Secondary | ICD-10-CM

## 2020-09-22 DIAGNOSIS — L02215 Cutaneous abscess of perineum: Secondary | ICD-10-CM | POA: Diagnosis not present

## 2020-09-22 IMAGING — MR MR PELVIS WO/W CM
7 of 12 series · 28 of 48 positions shown · IV contrast (gadavist)
Comparison: Multiple CT scans dating [DATE], [DATE],
[DATE].

CLINICAL DATA: Perineal abscess.

EXAM:
MRI PELVIS WITHOUT AND WITH CONTRAST
TECHNIQUE: Multiplanar multisequence MR imaging of the pelvis was performed
both before and after administration of intravenous contrast.
CONTRAST:  6.5mL GADAVIST GADOBUTROL 1 MMOL/ML IV SOLN

[Series 2: T2 · coronal · 6.0mm · 1.56mm/px · 2 of 30 slices shown (1 of 4)]
[im 1/30]
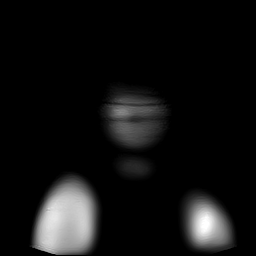
[im 30/30]
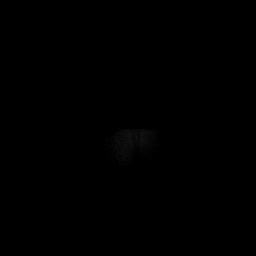

[Series 3: T2 · axial · 5.0mm · 0.51mm/px · z∈[-183,+15]mm · 3 of 34 slices shown (2 of 4)]
[im 1/34]
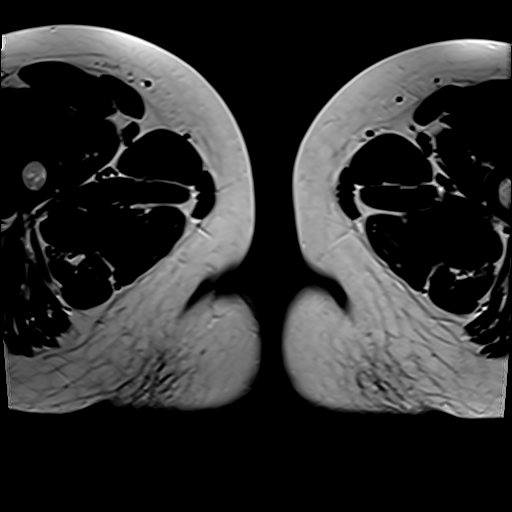
[im 17/34]
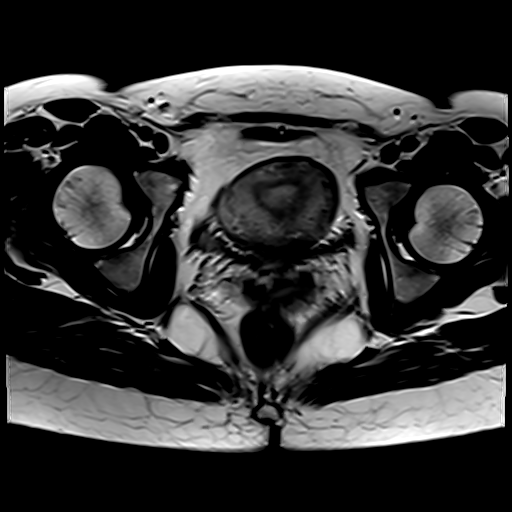
[im 34/34]
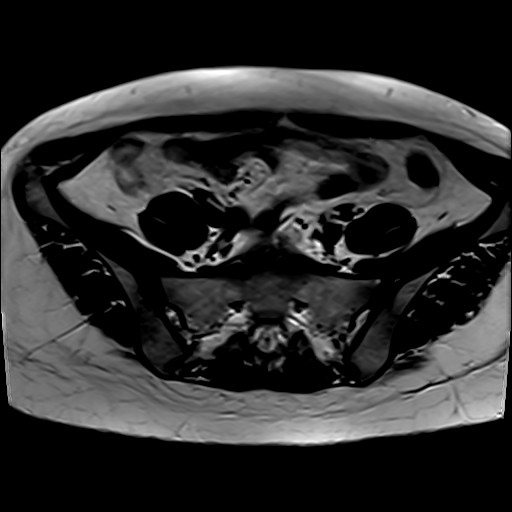

[Series 4: T2 · sagittal · 3.0mm · 0.43mm/px · 3 of 40 slices shown (3 of 4)]
[im 1/40]
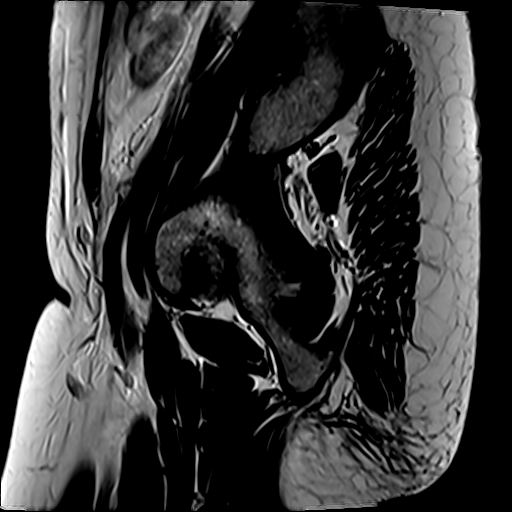
[im 20/40]
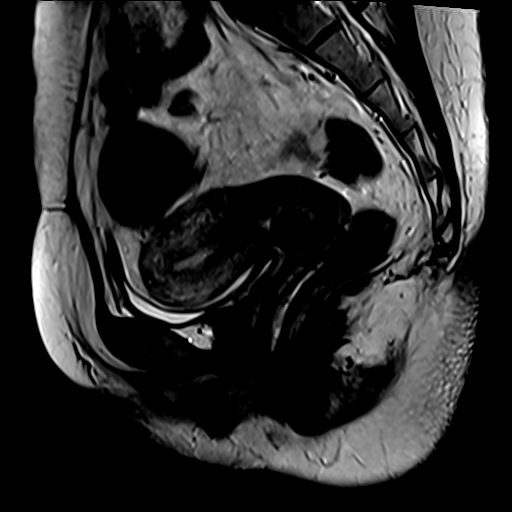
[im 40/40]
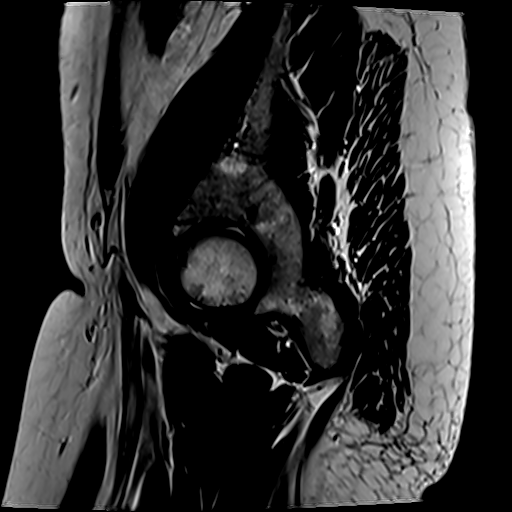

[Series 5: T2 fat-sat · axial · 5.0mm · 0.51mm/px · z∈[-183,+15]mm · 4 of 34 slices shown]
[im 1/34]
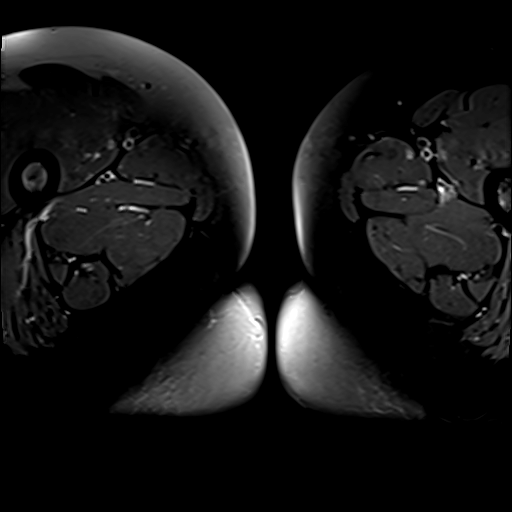
[im 12/34]
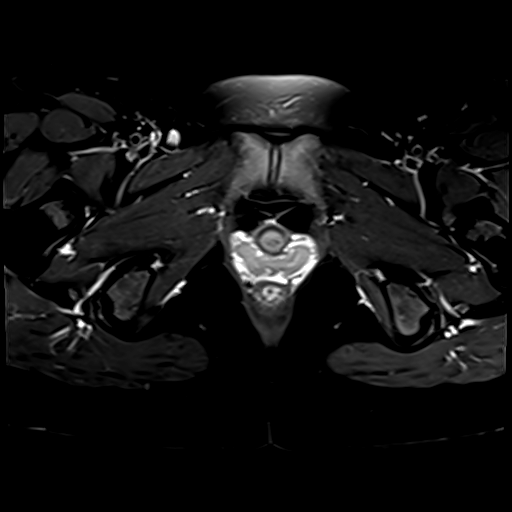
[im 23/34]
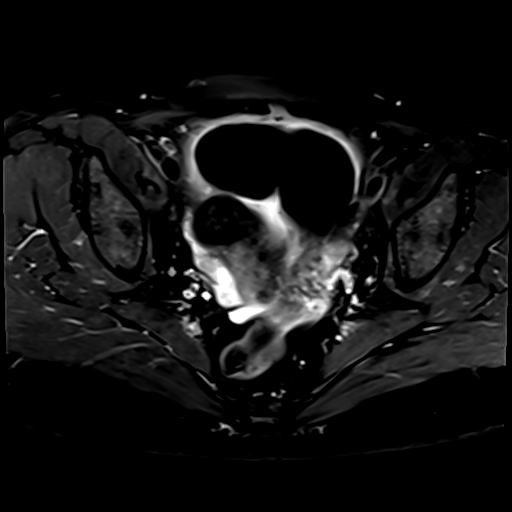
[im 34/34]
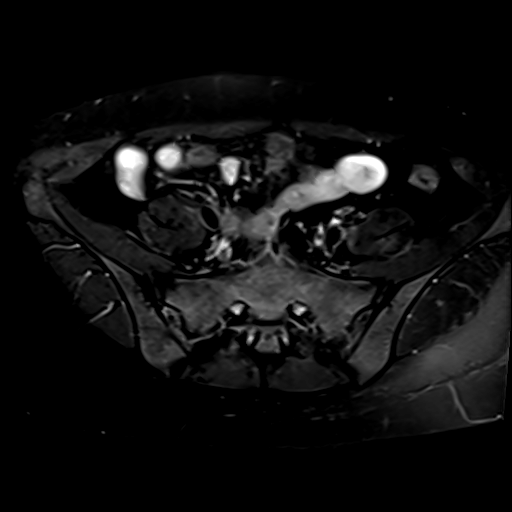

[Series 6: T2 · coronal · 4.0mm · 0.51mm/px · 4 of 34 slices shown (4 of 4)]
[im 1/34]
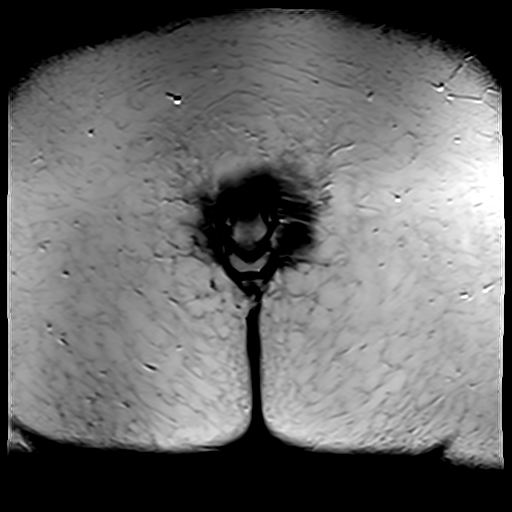
[im 12/34]
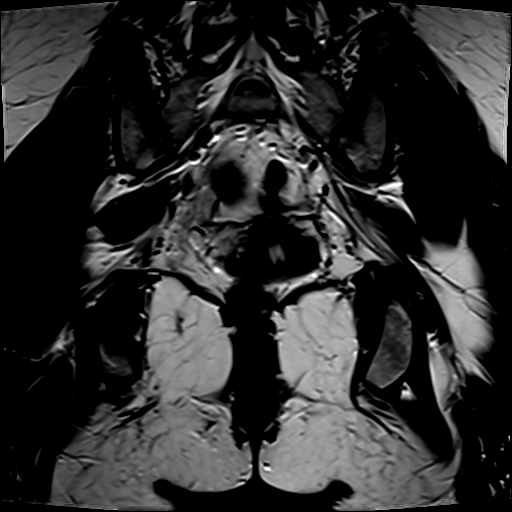
[im 23/34]
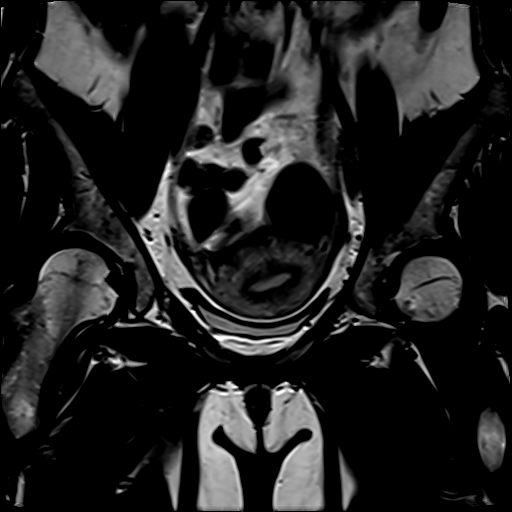
[im 34/34]
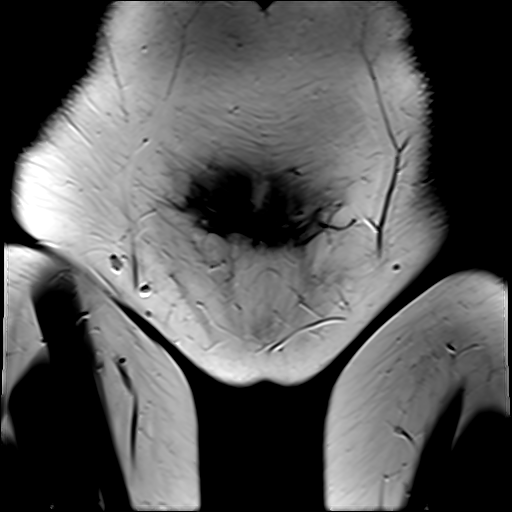

[Series 7: T1 · axial · 4.0mm · 0.84mm/px · z∈[-173,+47]mm · 6 of 56 slices shown (1 of 2)]
[im 1/56]
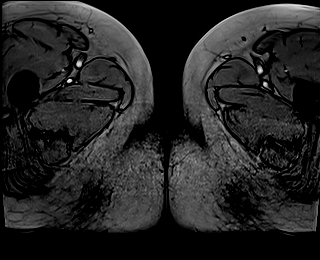
[im 12/56]
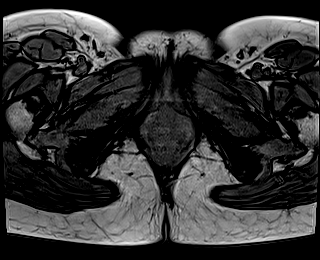
[im 23/56]
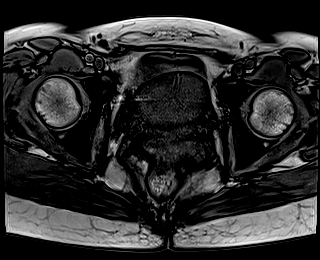
[im 34/56]
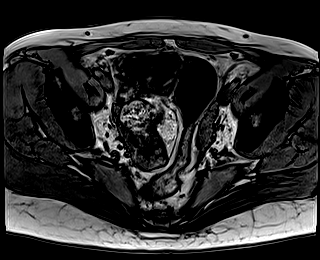
[im 45/56]
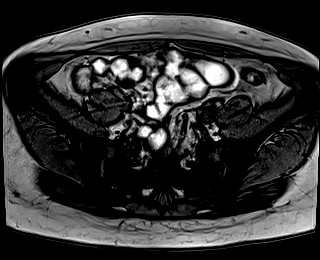
[im 56/56]
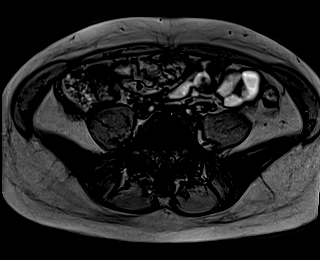

[Series 8: T1 · axial · 4.0mm · 0.84mm/px · z∈[-173,+47]mm · 6 of 56 slices shown (2 of 2)]
[im 1/56]
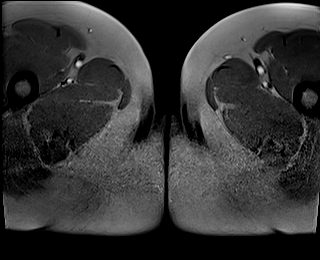
[im 12/56]
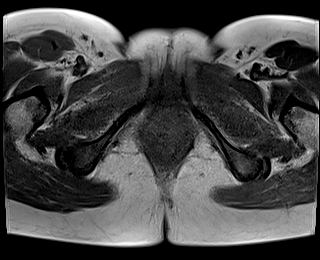
[im 23/56]
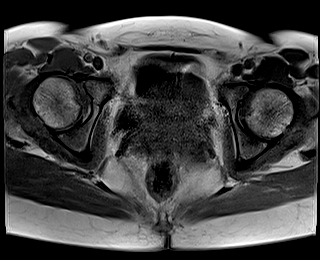
[im 34/56]
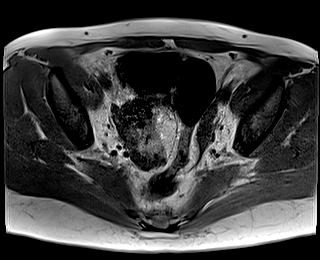
[im 45/56]
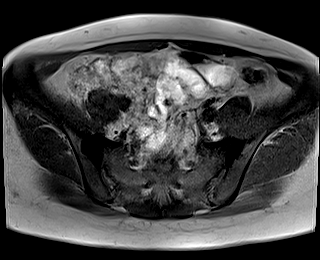
[im 56/56]
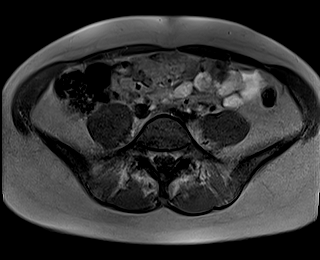

[28 of 48 positions shown; findings below may reference images not displayed]

FINDINGS: Urinary Tract:  No abnormality visualized.

Bowel:  Unremarkable visualized pelvic bowel loops.

Vascular/Lymphatic: No pathologically enlarged lymph nodes. No
significant vascular abnormality seen.

Reproductive:  No mass or other significant abnormality

Other: Inflammatory changes are identified in the right paramidline
perineum. The prominent enhancing fluid collection seen on the CT
scan from [DATE] are no longer evident although there is a tiny
residual 8 mm fluid collection in the right ischioanal fossa. A
linear band of inflammatory tissue tracks from this region
anteromedially towards the posterior introitus. There is no
convincing evidence for a transsphincteric perianal fistula although
a very thin finger of edema may track towards the 9-10 o'clock
region of the anus. No defect or abnormal enhancement within the
internal sphincter.

Musculoskeletal: No suspicious bone lesions identified.
IMPRESSION: Tiny 8 mm focus of focal edema or tiny abscess identified right
ischial anal fossa with a bandlike collection of inflammatory tissue
tracking anteromedially from this collection to the posterolateral
right lower vagina/introitus. Given the appearance of the cystic
change on the [DATE] exam (subsequently drained surgically)
imaging appearance is felt to be most likely related to residual
infectious/inflammatory tissue arising from the region of the right
posterior introitus, potentially subsequent to an infected
Bartholin's cyst. No MR findings today to suggest definite
perirectal/perianal fistula.

## 2020-09-22 MED ORDER — GADOBUTROL 1 MMOL/ML IV SOLN
7.5000 mL | Freq: Once | INTRAVENOUS | Status: AC | PRN
  Administered 2020-09-22: 6.5 mL via INTRAVENOUS

## 2020-09-22 MED ORDER — GADOBUTROL 1 MMOL/ML IV SOLN: 6.5000 mL | Freq: Once | INTRAVENOUS | Status: DC | PRN

## 2020-09-22 NOTE — Progress Notes (Signed)
Lab visit: Hepatitis B Surface Antibody and MMR immunity -- for immigration.

## 2020-09-25 ENCOUNTER — Telehealth: Payer: Self-pay | Admitting: Family Medicine

## 2020-09-25 DIAGNOSIS — R102 Pelvic and perineal pain: Secondary | ICD-10-CM | POA: Diagnosis not present

## 2020-09-25 LAB — MEASLES/MUMPS/RUBELLA IMMUNITY
Mumps IgG: <9 AU/mL — ABNORMAL LOW
Rubella: 5.62 Index
Rubeola IgG: 104 AU/mL

## 2020-09-25 LAB — HEPATITIS B SURFACE ANTIBODY,QUALITATIVE: Hep B S Ab: REACTIVE — AB

## 2020-09-25 NOTE — Telephone Encounter (Signed)
Looks like you are immune to everything except mumps.  They might require you to get a booster.  [facility]

## 2020-09-26 ENCOUNTER — Telehealth: Payer: Self-pay

## 2020-09-26 NOTE — Telephone Encounter (Signed)
Printed out the patient's lab results from 09/22/20 - she will come by to pick these up.

## 2020-09-28 ENCOUNTER — Encounter: Payer: Self-pay | Admitting: Obstetrics and Gynecology

## 2020-09-29 ENCOUNTER — Encounter: Payer: Self-pay | Admitting: Obstetrics and Gynecology

## 2020-09-29 DIAGNOSIS — Z23 Encounter for immunization: Secondary | ICD-10-CM | POA: Diagnosis not present

## 2020-10-03 ENCOUNTER — Other Ambulatory Visit: Payer: Self-pay

## 2020-10-03 ENCOUNTER — Ambulatory Visit: Payer: 59 | Admitting: Obstetrics and Gynecology

## 2020-10-03 ENCOUNTER — Encounter: Payer: Self-pay | Admitting: Obstetrics and Gynecology

## 2020-10-03 VITALS — BP 110/64 | HR 97 | Ht 65.0 in | Wt 141.0 lb

## 2020-10-03 DIAGNOSIS — N764 Abscess of vulva: Secondary | ICD-10-CM | POA: Diagnosis not present

## 2020-10-03 DIAGNOSIS — K219 Gastro-esophageal reflux disease without esophagitis: Secondary | ICD-10-CM | POA: Insufficient documentation

## 2020-10-03 MED ORDER — SULFAMETHOXAZOLE-TRIMETHOPRIM 800-160 MG PO TABS: 1.0000 | ORAL_TABLET | Freq: Two times a day (BID) | ORAL | 0 refills | Status: DC

## 2020-10-03 MED ORDER — LIDOCAINE 5 % EX OINT: 1.0000 "application " | TOPICAL_OINTMENT | Freq: Four times a day (QID) | CUTANEOUS | 0 refills | Status: DC | PRN

## 2020-10-03 NOTE — Progress Notes (Signed)
35 y.o. G1P1002 Married Asian Not Hispanic or Latino female here for a vulvar abcess. She underwent I&D of a right labial abscess on 07/26/20 by General Surgery and was given a 10 day course of Flagyl. No connection of the abscess to the rectum was found. She was referred her by Dr Cliffton Asters in General Surgery for persistent drainage.  At her last visit on 09/25/20 she c/o persistent drainage, she declined exam.  Since her exam last week she feels an increase in pain and swelling. She has had some discharge today, not sure if it is from the swelling. She has been on several courses of antibiotics (amoxicillin and flagyl). She was started on flagyl 2 weeks ago. She was given a 2 week course and stopped afte 12 days, felt dizzy and had stomach upset with the flagyl.   Culture of the abscess at the time of her surgery returned with e coli.   09/25/20 MRI: Other: Inflammatory changes are identified in the right paramidline perineum. The prominent enhancing fluid collection seen on the CT scan from 07/26/2020 are no longer evident although there is a tiny residual 8 mm fluid collection in the right ischioanal fossa. A linear band of inflammatory tissue tracks from this region anteromedially towards the posterior introitus. There is no convincing evidence for a transsphincteric perianal fistula although a very thin finger of edema may track towards the 9-10 o'clock region of the anus. No defect or abnormal enhancement within the internal sphincter.  Musculoskeletal: No suspicious bone lesions identified.  IMPRESSION: Tiny 8 mm focus of focal edema or tiny abscess identified right ischial anal fossa with a bandlike collection of inflammatory tissue tracking anteromedially from this collection to the posterolateral right lower vagina/introitus. Given the appearance of the cystic change on the 07/26/2020 exam (subsequently drained surgically) imaging appearance is felt to be most likely related to  residual infectious/inflammatory tissue arising from the region of the right posterior introitus, potentially subsequent to an infected Bartholin's cyst. No MR findings today to suggest definite perirectal/perianal fistula.   Electronically Signed   By: Kennith Center M.D.   On: 09/25/2020 11:22     Period Cycle (Days): 28 Period Duration (Days): 3 Period Pattern: Regular Menstrual Flow: Moderate Menstrual Control: Thin pad Menstrual Control Change Freq (Hours): 1 Dysmenorrhea: None  Patient's last menstrual period was 09/23/2020.          Sexually active: Yes.    The current method of family planning is condoms     Health Maintenance: Pap:  4 years ago WNL  History of abnormal Pap:  no MMG:  None  BMD:   None  Colonoscopy: none  TDaP:  5 years ago  Gardasil:  Never    reports that she has never smoked. She has never used smokeless tobacco. She reports that she does not drink alcohol and does not use drugs.  No past medical history on file.  Past Surgical History:  Procedure Laterality Date  . INCISION AND DRAINAGE PERIRECTAL ABSCESS Right 07/26/2020   Procedure: IRRIGATION AND DEBRIDEMENT right labial ABSCESS;  Surgeon: Fritzi Mandes, MD;  Location: [facility];  Service: General;  Laterality: Right;    Current Outpatient Medications  Medication Sig Dispense Refill  . acetaminophen (TYLENOL) 500 MG tablet Take 500 mg by mouth every 6 (six) hours as needed for mild pain.    . Multiple Vitamin (MULTIVITAMIN WITH MINERALS) TABS tablet Take 1 tablet by mouth daily.    Marland Kitchen omega-3 acid ethyl esters (LOVAZA)  1 g capsule Take 1 g by mouth daily.     No current facility-administered medications for this visit.    Family History  Problem Relation Age of Onset  . Healthy Mother   . Diabetes Father   . Cancer Maternal Grandmother   . Breast cancer Maternal Grandmother   . Cancer Maternal Grandfather   . Lung cancer Maternal Grandfather   . Healthy Paternal Grandmother    . Heart disease Paternal Grandmother   . Healthy Paternal Grandfather     Review of Systems  All other systems reviewed and are negative.   Exam:   BP 110/64   Pulse 97   Ht 5\' 5"  (1.651 m) Comment: with shoes on  Wt 141 lb (64 kg)   LMP 09/23/2020   SpO2 97%   BMI 23.46 kg/m   Weight change: @WEIGHTCHANGE @ Height:   Height: 5\' 5"  (165.1 cm) (with shoes on)  Ht Readings from Last 3 Encounters:  10/03/20 5\' 5"  (1.651 m)  07/28/20 5' 5.25" (1.657 m)    General appearance: alert, cooperative and appears stated age   Pelvic: External genitalia:  On the lower right inner labia majora is an ~4 mm raised area of granulation tissue. There is slight yellow d/c at the area of the granulation tissue. The patient is very tender with palpation in this area, lidocaine jelly applied. After allowing the lidocaine to take effect further examination was done. Slight drainage is noted when applying pressure to the surrounding area. No abscess is palpated. Bartholin's gland are not enlarged. She is tender with palpation of her lower vulva. Unable to pass anything into the abscess cavity.               Urethra:  normal appearing urethra with no masses, tenderness or lesions              Bartholins and Skenes: normal                   1. Vulvar abscess S/P I&D, s/p multiple rounds of amoxicillin and flagyl. Currently with slight drainage, no abscess felt. MRI suggests possible bartholin's cyst infection. No enlargement of the bartholin's gland noted.  -Use warm compresses and hot soaks 4 x a day - Wound culture sent - sulfamethoxazole-trimethoprim (BACTRIM DS) 800-160 MG tablet; Take 1 tablet by mouth 2 (two) times daily. One PO BID x 7 days  Dispense: 14 tablet; Refill: 0 - lidocaine (XYLOCAINE) 5 % ointment; Apply 1 application topically 4 (four) times daily as needed.  Dispense: 30 g; Refill: 0 -F/U in one week -When she has improved, can treat the granulation tissue  CC: Dr ,  Dr 10/05/20

## 2020-10-03 NOTE — Patient Instructions (Signed)
Use warm compresses or hot soaks 4 x a day

## 2020-10-06 LAB — WOUND CULTURE
MICRO NUMBER:: 11950002
SPECIMEN QUALITY:: ADEQUATE

## 2020-10-09 ENCOUNTER — Encounter: Payer: Self-pay | Admitting: Obstetrics and Gynecology

## 2020-10-09 ENCOUNTER — Other Ambulatory Visit: Payer: Self-pay | Admitting: Obstetrics and Gynecology

## 2020-10-09 DIAGNOSIS — N764 Abscess of vulva: Secondary | ICD-10-CM

## 2020-10-10 MED ORDER — SULFAMETHOXAZOLE-TRIMETHOPRIM 800-160 MG PO TABS: 1.0000 | ORAL_TABLET | Freq: Two times a day (BID) | ORAL | 0 refills | Status: DC

## 2020-10-10 MED ORDER — AMOXICILLIN-POT CLAVULANATE 875-125 MG PO TABS: 1.0000 | ORAL_TABLET | Freq: Two times a day (BID) | ORAL | 0 refills | Status: DC

## 2020-10-10 NOTE — Telephone Encounter (Signed)
The patient has had an incredibly difficult time getting this abscess to heal. Please call in Bactrim DS, 1 po BID, #14 plus Augmentin 875 mg BID, #14. Have her keep her appointment later this week. If this doesn't fix the problem, I think she may need an ID consultation.

## 2020-10-10 NOTE — Telephone Encounter (Signed)
Returned call to patient. Message given to patient as seen below from Dr. Oscar La and patient verbalized understanding.   Prescription for Augmentin #14, 0RF and Bactrim DS #14, 0RF sent to confirmed pharmacy, [facility] on [address]. Advised patient to discuss with Pharmacist the best way to take the 2 antibiotics to avoid stomach upset/interaction. Patient verbalized understanding. Aware to keep appointment as scheduled for 10-12-20 with Dr. Oscar La. Patient agreeable.  Routing to provider and will close encounter.

## 2020-10-10 NOTE — Telephone Encounter (Signed)
Patient left voicemail on triage line requesting refill of antibiotic or stronger antibiotic prescribed.  RN returned call to patient. Patient states that she has 1 pill left of antibiotic that she will take tonight. Scheduled for follow up on 10-12-20. Patient states that area is getting better, "but still not good." States she and her husband have been pushing on the area and "a lot" is coming out, "more than before." Patient states the pain is still there. Using naproxen which helps and is still doing warm compresses 4x/day. RN advised patient to try and avoid squeezing so much on the area and to let it drain on it's own. RN advised to take final antibiotic pill tonight as prescribed and to keep follow up appointment on 6-9. Would review with Dr. Oscar La and return call if any additional recommendations. Patient agreeable.   Routing to provider for review.

## 2020-10-12 ENCOUNTER — Encounter: Payer: Self-pay | Admitting: Obstetrics and Gynecology

## 2020-10-12 ENCOUNTER — Other Ambulatory Visit: Payer: Self-pay

## 2020-10-12 ENCOUNTER — Ambulatory Visit: Payer: 59 | Admitting: Obstetrics and Gynecology

## 2020-10-12 VITALS — BP 120/60 | HR 92 | Ht 65.0 in | Wt 141.0 lb

## 2020-10-12 DIAGNOSIS — K610 Anal abscess: Secondary | ICD-10-CM

## 2020-10-12 DIAGNOSIS — N764 Abscess of vulva: Secondary | ICD-10-CM

## 2020-10-12 NOTE — Progress Notes (Addendum)
GYNECOLOGY  VISIT   HPI: 35 y.o.   Married Asian Not Hispanic or Latino  female   [id number] with Patient's last menstrual period was 09/23/2020.   here for  vulvar abscess. Patient states that she feels like she is getting worse.    She underwent I&D of a right labial abscess on 07/26/20 by General Surgery and was given a 10 day course of Flagyl. No connection of the abscess to the rectum was found. She has been on several courses of antibiotics since then. Typically feels better while on the antibiotics, but then symptoms worsen.  09/25/20 MRI: Other: Inflammatory changes are identified in the right paramidline perineum. The prominent enhancing fluid collection seen on the CT scan from 07/26/2020 are no longer evident although there is a tiny residual 8 mm fluid collection in the right ischioanal fossa. A linear band of inflammatory tissue tracks from this region anteromedially towards the posterior introitus. There is no convincing evidence for a transsphincteric perianal fistula although a very thin finger of edema may track towards the 9-10 o'clock region of the anus. No defect or abnormal enhancement within the internal sphincter.   Musculoskeletal: No suspicious bone lesions identified.   IMPRESSION: Tiny 8 mm focus of focal edema or tiny abscess identified right ischial anal fossa with a bandlike collection of inflammatory tissue tracking anteromedially from this collection to the posterolateral right lower vagina/introitus. Given the appearance of the cystic change on the 07/26/2020 exam (subsequently drained surgically) imaging appearance is felt to be most likely related to residual infectious/inflammatory tissue arising from the region of the right posterior introitus, potentially subsequent to an infected Bartholin's cyst. No MR findings today to suggest definite perirectal/perianal fistula  On 10/03/20 she was seen here: Pelvic: External genitalia:  On the lower right  inner labia majora is an ~4 mm raised area of granulation tissue. There is slight yellow d/c at the area of the granulation tissue. The patient is very tender with palpation in this area, lidocaine jelly applied. After allowing the lidocaine to take effect further examination was done. Slight drainage is noted when applying pressure to the surrounding area. No abscess is palpated. Bartholin's gland are not enlarged. She is tender with palpation of her lower vulva. Unable to pass anything into the abscess cavity.               Urethra:  normal appearing urethra with no masses, tenderness or lesions              Bartholins and Skenes: normal     10/03/20 culture:  Component 9 d ago   MICRO NUMBER: [id number]   SPECIMEN QUALITY: Adequate   SOURCE: RIGHT VULVAR ABSCESS   STATUS: FINAL   GRAM STAIN: Many White blood cells seen No epithelial cells seen Few Gram positive bacilli   RESULT: A mix of organisms of questionable significance was recovered on culture and not further identified. (Note: Growth did not detect the presence of S.aureus, beta-hemolytic Streptococci or P.aeruginosa).  Resulting Agency     She was treated with bactrim, called to say that she still wasn't better on 10/09/20. On 10/10/20 another week long course of Bactrim and a week long course of Augmentin was called in for her.   She feels like she is getting worse. Very tender on the right lower vulva to around the anus. The drainage has increased. Doesn't look swollen, but feels swollen inside.   GYNECOLOGIC HISTORY: Patient's last menstrual period was 09/23/2020. Contraception:comdoms  Menopausal  hormone therapy: NA        OB History     Gravida  1   Para  1   Term  1   Preterm      AB      Living  2      SAB      IAB      Ectopic      Multiple  1   Live Births  2              Patient Active Problem List   Diagnosis Date Noted   Gastroesophageal reflux disease 10/03/2020   History of gestational  diabetes 07/28/2020   Family history of diabetes mellitus 07/28/2020   Labial abscess 07/26/2020   Abscess 07/26/2020    No past medical history on file.  Past Surgical History:  Procedure Laterality Date   INCISION AND DRAINAGE PERIRECTAL ABSCESS Right 07/26/2020   Procedure: IRRIGATION AND DEBRIDEMENT right labial ABSCESS;  Surgeon: Fritzi Mandes, MD;  Location: [facility] OR;  Service: General;  Laterality: Right;    Current Outpatient Medications  Medication Sig Dispense Refill   amoxicillin-clavulanate (AUGMENTIN) 875-125 MG tablet Take 1 tablet by mouth 2 (two) times daily.     lidocaine (XYLOCAINE) 5 % ointment Apply 1 application topically 4 (four) times daily as needed. 30 g 0   Multiple Vitamin (MULTIVITAMIN WITH MINERALS) TABS tablet Take 1 tablet by mouth daily.     omega-3 acid ethyl esters (LOVAZA) 1 g capsule Take 1 g by mouth daily.     sulfamethoxazole-trimethoprim (BACTRIM DS) 800-160 MG tablet Take 1 tablet by mouth 2 (two) times daily.     No current facility-administered medications for this visit.     ALLERGIES: Flagyl [metronidazole]  Family History  Problem Relation Age of Onset   Healthy Mother    Diabetes Father    Cancer Maternal Grandmother    Breast cancer Maternal Grandmother    Cancer Maternal Grandfather    Lung cancer Maternal Grandfather    Healthy Paternal Grandmother    Heart disease Paternal Grandmother    Healthy Paternal Grandfather     Social History   Socioeconomic History   Marital status: Married    Spouse name: Not on file   Number of children: Not on file   Years of education: Not on file   Highest education level: Not on file  Occupational History   Occupation: Airline pilot  Tobacco Use   Smoking status: Never   Smokeless tobacco: Never  Substance and Sexual Activity   Alcohol use: Never   Drug use: Never   Sexual activity: Yes    Birth control/protection: Condom  Other Topics Concern   Not on file  Social History  Narrative   Not on file   Social Determinants of Health   Financial Resource Strain: Not on file  Food Insecurity: Not on file  Transportation Needs: Not on file  Physical Activity: Not on file  Stress: Not on file  Social Connections: Not on file  Intimate Partner Violence: Not on file    Review of Systems  All other systems reviewed and are negative.  PHYSICAL EXAMINATION:    BP 120/60   Pulse 92   Ht 5\' 5"  (1.651 m)   Wt 141 lb (64 kg)   LMP 09/23/2020   SpO2 99%   BMI 23.46 kg/m     General appearance: alert, cooperative and appears stated age  Pelvic: External genitalia:  on the right lower  inner labia majora is an ~7 mm raised area of granulation tissue, no obvious external swelling noted. With palpation of the surrounding area, a slight amount of yellow discharge is expressed on the lateral aspect of the granulation tissue. Slight induration/cord noted with palpation in the right lower vulva/right perianal region, but to obvious abscess to drain.               Urethra:  normal appearing urethra with no masses, tenderness or lesions              Bartholins and Skenes: normal                 Rectovaginal: no perianal mass palpated.               Anus:  normal sphincter tone, no lesions  Chaperone was present for exam.  1. Vulvar abscess/Perianal abscess The patient is s/p incision and drainage in the OR with General Surgery, they didn't feel there was a connection with her rectum. She is s/p multiple courses of antibiotics and continues to have significant pain and drainage. Nothing palpable to incise on exam. Culture with normal flora. The MRI mentions possible infected bartholin's gland, but the bartholin's doesn't feel enlarged on exam.  -She is currently on Bactrim Ds and Augmentin and feels she is getting worse -Will discuss with GYN Onc and consider referral there -Will place referral to ID  Over 20 minutes in total patient care  Addendum: I reached out to Dr  Pricilla Holm in GYN Oncology, she would be happy to see the patient. Will place referral.

## 2020-10-16 ENCOUNTER — Telehealth: Payer: Self-pay | Admitting: *Deleted

## 2020-10-16 ENCOUNTER — Encounter: Payer: Self-pay | Admitting: Gynecologic Oncology

## 2020-10-16 DIAGNOSIS — Z23 Encounter for immunization: Secondary | ICD-10-CM | POA: Diagnosis not present

## 2020-10-16 NOTE — Telephone Encounter (Signed)
Called and spoke with the patient; scheduled a new patient appt for 6/15 at 10:30 am. Patient given an arrival time of 10 am. Patient given the address and phone number for the clinic. Patient also given the policy for mask, visitors and parking

## 2020-10-16 NOTE — Telephone Encounter (Signed)
Patient scheduled on 6/15 at 10:30 am  with Dr.Tucker.

## 2020-10-16 NOTE — Telephone Encounter (Signed)
Staff message sent to Michelle to call and schedule and let me know time and date.  

## 2020-10-16 NOTE — Telephone Encounter (Signed)
-----   Message from Romualdo Bolk, MD sent at 10/13/2020  1:16 PM EDT ----- Please set this patient up for a consultation with Dr Pricilla Holm in GYN Oncology for a vulvar/perianal abscess. I've spoken to her about this patient and she said she is happy to see her. Please also let the patient know this referral has been placed. I think we start with Dr Pricilla Holm and if still having issues then send her to infectious disease. Thanks, Noreene Larsson

## 2020-10-18 ENCOUNTER — Other Ambulatory Visit: Payer: Self-pay

## 2020-10-18 ENCOUNTER — Inpatient Hospital Stay: Payer: 59 | Attending: Gynecologic Oncology | Admitting: Gynecologic Oncology

## 2020-10-18 ENCOUNTER — Encounter: Payer: Self-pay | Admitting: Gynecologic Oncology

## 2020-10-18 VITALS — BP 100/67 | HR 79 | Temp 97.5°F | Resp 20 | Wt 136.6 lb

## 2020-10-18 DIAGNOSIS — T8149XA Infection following a procedure, other surgical site, initial encounter: Secondary | ICD-10-CM | POA: Diagnosis not present

## 2020-10-18 DIAGNOSIS — N764 Abscess of vulva: Secondary | ICD-10-CM | POA: Diagnosis not present

## 2020-10-18 MED ORDER — NAPROXEN 500 MG PO TABS: 500.0000 mg | ORAL_TABLET | Freq: Every day | ORAL | 1 refills | Status: DC | PRN

## 2020-10-18 NOTE — Progress Notes (Signed)
GYNECOLOGIC ONCOLOGY NEW PATIENT CONSULTATION   Patient Name: Sabrina Mullins  Patient Age: 35 y.o. Date of Service: 10/18/20 Referring Provider: Gertie ExonJertson, Jill MD  Primary Care Provider: Lavada MesiHilts, Michael, MD Consulting Provider: Eugene GarnetKatherine Taneeka Curtner, MD   Assessment/Plan:  Premenopausal patient with vulvar/peri-rectal abscess who underwent I&D initially in March who has now completed multiple courses of antibiotics without evidence of persistent abscess on imaging but with continued pain and drainage.  Discussed recent MRI findings with the patient as well as my exam today. While no obvious area of fluctuance, I am concerned that she has a persistent abscess tract/cavity that is causing continued pain/drainage. While no evidence of rectal fistula on imaging or exam, I think that repeat EUA under sedation to help assure this is not cause of persistent symptoms is warranted. If no rectal fistula appreciated, I'm hoping there may be a tract or abscess cavity that I can identify to excise.   The patient is amenable to moving forward with scheduling an exam under anesthesia, possible drainage of prior abscess versus excision, and any other indicated procedures. The MRI comments on a Bartholin's gland although I do not appreciate an enlarged gland in the expected area that a Bartholin's cyst would be palpated.   I discussed risks of the procedure including that I may only perform an exam under anesthesia. We discussed that she may have persistence or worsening of current symptoms. We also discussed risks of surgery that include but are not limited to infection; wound separation; injury to adjacent organs; bleeding which may require blood transfusion; anesthesia risk; thromboembolic events; possible death; unforeseen complications; possible need for re-exploration; medical complications such as heart attack, stroke, pleural effusion and pneumonia. The patient will receive DVT and antibiotic prophylaxis as indicated.  She voiced a clear understanding. She had the opportunity to ask questions. Perioperative instructions were reviewed with her.   Plan is for surgery next Wednesday. I will speak with Dr. Cliffton AstersWhite who has seen her previously in the outpatient setting prior to the procedure.   A copy of this note was sent to the patient's referring provider.   65 minutes of total time was spent for this patient encounter, including preparation, face-to-face counseling with the patient and coordination of care, and documentation of the encounter.   Eugene GarnetKatherine Earsie Humm, MD  Division of Gynecologic Oncology  Department of Obstetrics and Gynecology  [facility]  ___________________________________________  Chief Complaint: Chief Complaint  Patient presents with   Labial abscess    History of Present Illness:  Sabrina Mullins is a 35 y.o. y.o. female who is seen in consultation at the request of Dr. Oscar LaJertson for an evaluation of persistent vulvar pain and drainage in the setting of a perineal abscess.  The details of her course and treatment are as follows.  Treatment history: February/March 2022: Patient began to feel pain near her anus after bowel movements. Saw GI for her symptoms as she thought pain was related to hemorrhoids, was referred to surgery. 07/26/2020: Seen by Dr. Cliffton AstersWhite in the outpatient setting for swelling and pain of her right labia and perineum for the last week, given exam findings of induration and tenderness was referred to the emergency department. 07/26/2020: CT scan showed a 5 cm rim-enhancing fluid collection in the right perineum tracking towards the anus.  Patient noted to have white blood cell count of 13.  Attempts in the emergency department to aspirate the abscess were unsuccessful and ultimately she was taken to the operating room for  I&D of right posterior labial abscess.  Findings at the time of surgery were a large right posterior labial/perineal abscess tracking  superiorly, no confusion with the rectum.  Culture from the abscess showed moderate E. coli, moderate Bacteroides fragilis, moderate Bacteroides ovatus, beta-lactamase positive.  E. coli infection was noted to be pansensitive.  The patient was discharged home on 3/24 after packing was removed.  Discharged on Augmentin. 07/28/2020: Established with a primary care provider.  Hemoglobin A1c at that visit was 5.2%. 07/31/2020: Seen for follow-up at [facility] surgery.  At that time was noted to have a 2 cm area of granulation tissue in the posterior labia with small amount of firmness lateral to this. 08/10/2020: Seen for follow-up at [facility] surgery, additional lancing performed of abscess in the office.  In continued pain, CT obtained. CT of the pelvis showed resolution of perineal abscess collections with minimal residual soft tissue infiltration. The patient reports having significant improvement in her symptoms after finishing 10 days of antibiotics.  Then, after her menses started, she began having perineal pain and discharge again. 09/12/2020: Started on Augmentin. 09/13/2020: Patient seen in the emergency department with worsening rectal pain and drainage.  Labs were unremarkable.  CT scan of the pelvis without contrast did not show fluid collection or abscess.  Recommendation made to continue outpatient antibiotics started the previous day as well as to use warm compresses. 09/18/2020: Saw Dr. Cliffton Asters with [facility] surgery.  Scant yellow discharge was noted draining from the right posterior labia, no erythema or fluctuance was appreciated.  Given persistent symptoms, MRI was recommended.  Patient was also prescribed Flagyl. 10/03/2020: Seen by GYN.  4 mm raised area on the right inner labia described as granulation tissue with slight yellow discharge, very tender with palpation.  Culture taken of her discharge showed many white blood cells, few gram-positive bacilli.  Mix of organisms of  questionable significance recovered on culture and not further identified.  Patient was prescribed Bactrim.  Given persistent symptoms when she called on 6/6, she was prescribed another week course of Bactrim and was also started on Augmentin. 10/12/2020: Saw Dr. Oscar La, continued area of granulation tissue with some yellow discharge noted.  Decision made to refer to infectious disease.  Patient reports that overall her symptoms have improved in the last 2 days on antibiotics.  She now is having some vulvar pain but decreased and her yellow discharge has also decreased.  She is using naproxen as needed for pain.  She has not used any today, but but typically takes it in the morning which helps provide relief until the evening.  Her appetite is somewhat decreased while on antibiotics.  She denies any associated nausea or emesis.  She has not been sleeping well, initially secondary to vulvar pain, but now she is unsure why she wakes up during the night.  She reports regular soft bowel movements.  She intermittently has pain with bowel movements although less so when she has been on antibiotics.  She denies any urinary systems other than pain intermittently when urine hits her vulvar area.  She has been using heating pads and sitz bath's.  PAST MEDICAL HISTORY:  Past Medical History:  Diagnosis Date   Vulvar abscess 2022     PAST SURGICAL HISTORY:  Past Surgical History:  Procedure Laterality Date   CESAREAN SECTION  02/2016   INCISION AND DRAINAGE PERIRECTAL ABSCESS Right 07/26/2020   Procedure: IRRIGATION AND DEBRIDEMENT right labial ABSCESS;  Surgeon: Fritzi Mandes,  MD;  Location: [facility] OR;  Service: General;  Laterality: Right;    OB/GYN HISTORY:  OB History  Gravida Para Term Preterm AB Living  1 1 1     2   SAB IAB Ectopic Multiple Live Births        1 2    # Outcome Date GA Lbr Len/2nd Weight Sex Delivery Anes PTL Lv  1A Term     M CS-LTranv   LIV  1B Term     M CS-LVertical   LIV     Patient's last menstrual period was 09/23/2020.  MEDICATIONS: Outpatient Encounter Medications as of 10/18/2020  Medication Sig   amoxicillin-clavulanate (AUGMENTIN) 875-125 MG tablet Take 1 tablet by mouth 2 (two) times daily.   lidocaine (XYLOCAINE) 5 % ointment Apply 1 application topically 4 (four) times daily as needed.   Multiple Vitamin (MULTIVITAMIN WITH MINERALS) TABS tablet Take 1 tablet by mouth daily.   omega-3 acid ethyl esters (LOVAZA) 1 g capsule Take 1 g by mouth daily.   sulfamethoxazole-trimethoprim (BACTRIM DS) 800-160 MG tablet Take 1 tablet by mouth 2 (two) times daily.   [DISCONTINUED] naproxen (NAPROSYN) 500 MG tablet Take 500 mg by mouth daily as needed.   naproxen (NAPROSYN) 500 MG tablet Take 1 tablet (500 mg total) by mouth daily as needed for moderate pain.   No facility-administered encounter medications on file as of 10/18/2020.    ALLERGIES:  Allergies  Allergen Reactions   Flagyl [Metronidazole] Nausea And Vomiting     FAMILY HISTORY:  Family History  Problem Relation Age of Onset   Healthy Mother    Diabetes Father    Cancer Maternal Grandmother    Breast cancer Maternal Grandmother    Cancer Maternal Grandfather    Lung cancer Maternal Grandfather    Healthy Paternal Grandmother    Heart disease Paternal Grandmother    Healthy Paternal Grandfather    Colon cancer Neg Hx    Ovarian cancer Neg Hx    Endometrial cancer Neg Hx    Pancreatic cancer Neg Hx    Prostate cancer Neg Hx      SOCIAL HISTORY:  Social Connections: Not on file    REVIEW OF SYSTEMS:  + pelvic pain, discharge, swollen gland. Denies appetite changes, fevers, chills, fatigue, unexplained weight changes. Denies hearing loss, neck lumps or masses, mouth sores, ringing in ears or voice changes. Denies cough or wheezing.  Denies shortness of breath. Denies chest pain or palpitations. Denies leg swelling. Denies abdominal distention, pain, blood in stools,  constipation, diarrhea, nausea, vomiting, or early satiety. Denies pain with intercourse, dysuria, frequency, hematuria or incontinence. Denies hot flashes, vaginal bleeding   Denies joint pain, back pain or muscle pain/cramps. Denies itching, rash, or wounds. Denies dizziness, headaches, numbness or seizures. Denies easy bruising or bleeding. Denies anxiety, depression, confusion, or decreased concentration.  Physical Exam:  Vital Signs for this encounter:  Blood pressure 100/67, pulse 79, temperature (!) 97.5 F (36.4 C), resp. rate 20, weight 136 lb 9.6 oz (62 kg), last menstrual period 09/23/2020, SpO2 99 %. Body mass index is 22.73 kg/m. General: Alert, oriented, no acute distress.  HEENT: Normocephalic, atraumatic. Sclera anicteric.  Chest: Clear to auscultation bilaterally. No wheezes, rhonchi, or rales. Cardiovascular: Regular rate and rhythm, no murmurs, rubs, or gallops.  Abdomen: Normoactive bowel sounds. Soft, nondistended, nontender to palpation. No masses or hepatosplenomegaly appreciated. No palpable fluid wave.  Extremities: Grossly normal range of motion. Warm, well perfused. No edema bilaterally.  Skin:  No rashes or lesions.  Lymphatics: No cervical, supraclavicular, or inguinal adenopathy.  GU: Female genitalia notable for small area of granulation tissue (1-65mm) along posterior aspect of right labia majora. No active drainage. Patient is very tender to palpation along right posterior labia and perineum. No obvious area of fluctuance. Single digit vaginal exam with no obvious masses. Rectal exam much less painful, no fistula palpated.  LABORATORY AND RADIOLOGIC DATA:  Outside medical records were reviewed to synthesize the above history, along with the history and physical obtained during the visit.   Lab Results  Component Value Date   WBC 6.7 09/13/2020   HGB 13.5 09/13/2020   HCT 39.9 09/13/2020   PLT 212 09/13/2020   GLUCOSE 97 09/13/2020   CHOL 133  07/28/2020   TRIG 147 07/28/2020   HDL 37 (L) 07/28/2020   LDLCALC 73 07/28/2020   ALT 11 07/28/2020   AST 12 07/28/2020   NA 136 09/13/2020   K 3.6 09/13/2020   CL 106 09/13/2020   CREATININE 0.67 09/13/2020   BUN 9 09/13/2020   CO2 24 09/13/2020   TSH 4.39 07/28/2020   HGBA1C 5.2 07/28/2020   MRI pelvis 5/23: IMPRESSION: Tiny 8 mm focus of focal edema or tiny abscess identified right ischial anal fossa with a bandlike collection of inflammatory tissue tracking anteromedially from this collection to the posterolateral right lower vagina/introitus. Given the appearance of the cystic change on the 07/26/2020 exam (subsequently drained surgically) imaging appearance is felt to be most likely related to residual infectious/inflammatory tissue arising from the region of the right posterior introitus, potentially subsequent to an infected Bartholin's cyst. No MR findings today to suggest definite perirectal/perianal fistula.

## 2020-10-18 NOTE — Patient Instructions (Signed)
Preparing for your Surgery  Plan for surgery on October 25, 2020 with Dr. Eugene Garnet at [facility]. You will be scheduled for a examination under anesthesia, possible incision and drainage of the vulva, possible abscess tract excision.   Pre-operative Testing -You will receive a phone call from presurgical testing at [facility] to discuss surgery instructions and arrange for lab work if needed.  -Bring your insurance card, copy of an advanced directive if applicable, medication list.  -You should not be taking blood thinners or aspirin at least ten days prior to surgery unless instructed by your surgeon.  -Do not take supplements such as fish oil (omega 3), red yeast rice, turmeric before your surgery. You want to avoid medications with aspirin in them including headache powders such as BC or Goody's), Excedrin migraine.  Day Before Surgery at Home -You will be advised you can have clear liquids up until 3 hours before your surgery.    Your role in recovery Your role is to become active as soon as directed by your doctor, while still giving yourself time to heal.  Rest when you feel tired. You will be asked to do the following in order to speed your recovery:  - Cough and breathe deeply. This helps to clear and expand your lungs and can prevent pneumonia after surgery.  - STAY ACTIVE WHEN YOU GET HOME. Do mild physical activity. Walking or moving your legs help your circulation and body functions return to normal. Do not try to get up or walk alone the first time after surgery.   -If you develop swelling on one leg or the other, pain in the back of your leg, redness/warmth in one of your legs, please call the office or go to the Emergency Room to have a doppler to rule out a blood clot. For shortness of breath, chest pain-seek care in the Emergency Room as soon as possible. - Actively manage your pain. Managing your pain lets you move in comfort. We  will ask you to rate your pain on a scale of zero to 10. It is your responsibility to tell your doctor or nurse where and how much you hurt so your pain can be treated.  Special Considerations -Your final pathology results from surgery should be available around one week after surgery and the results will be relayed to you when available.  -FMLA forms can be faxed to [phone] and please allow 5-7 business days for completion.  Pain Management After Surgery -Make sure that you have Tylenol and naproxen at home to use on a regular basis after surgery for pain control. We recommend alternating the medications since they work differently and are processed in the body differently for pain relief.  Bowel Regimen  It is important to prevent constipation and drink adequate amounts of liquids.   Risks of Surgery Risks of surgery are low but include bleeding, infection, damage to surrounding structures, re-operation, blood clots, and very rarely death.  AFTER SURGERY INSTRUCTIONS  Return to work:  2-3 days if applicable  Activity: 1. Be up and out of the bed during the day.  Take a nap if needed.  You may walk up steps but be careful and use the hand rail.  Stair climbing will tire you more than you think, you may need to stop part way and rest.   2. No lifting or straining for 4 weeks over 10 pounds. No pushing, pulling, straining for 4 weeks.  3.  No driving for minimum 24 hours after surgery.  Do not drive if you are taking narcotic pain medicine and make sure that your reaction time has returned.   4. You can shower as soon as the next day after surgery. Shower daily. You can perform sitz baths to cleanse the area.   5. No sexual activity and nothing in the vagina for 4 weeks if you have an incision.  8. You may experience vaginal spotting and discharge after surgery.  The spotting is normal but if you experience heavy bleeding, call our office.  9. Take Tylenol or naproxen first for  pain and only use narcotic pain medication (if you have an incision) for severe pain not relieved by the Tylenol or naproxen.  Monitor your Tylenol intake to a max of 4,000 mg in a 24 hour period. You can alternate these medications after surgery.  Diet: 1. Low sodium Heart Healthy Diet is recommended but you are cleared to resume your normal (before surgery) diet after your procedure.  2. It is safe to use a laxative, such as Miralax or Colace, if you have difficulty moving your bowels. You have been prescribed Sennakot at bedtime every evening to keep bowel movements regular and to prevent constipation.    Wound Care: 1. Keep clean and dry.  Shower daily.  Reasons to call the Doctor: Fever - Oral temperature greater than 100.4 degrees Fahrenheit Foul-smelling vaginal discharge Difficulty urinating Nausea and vomiting Increased pain at the site of the incision that is unrelieved with pain medicine. Difficulty breathing with or without chest pain New calf pain especially if only on one side Sudden, continuing increased vaginal bleeding with or without clots.   Contacts: For questions or concerns you should contact:  Dr. Eugene Garnet at [phone]  Warner Mccreedy, NP at [phone]  After Hours: call [phone] and have the GYN Oncologist paged/contacted (after 5 pm or on the weekends).  Messages sent via mychart are for non-urgent matters and are not responded to after hours so for urgent needs, please call the after hours number.

## 2020-10-18 NOTE — H&P (View-Only) (Signed)
GYNECOLOGIC ONCOLOGY NEW PATIENT CONSULTATION   Patient Name: Sabrina Mullins  Patient Age: 35 y.o. Date of Service: 10/18/20 Referring Provider: Jertson, Jill MD  Primary Care Provider: Hilts, Michael, MD Consulting Provider: Devian Bartolomei, MD   Assessment/Plan:  Premenopausal patient with vulvar/peri-rectal abscess who underwent I&D initially in March who has now completed multiple courses of antibiotics without evidence of persistent abscess on imaging but with continued pain and drainage.  Discussed recent MRI findings with the patient as well as my exam today. While no obvious area of fluctuance, I am concerned that she has a persistent abscess tract/cavity that is causing continued pain/drainage. While no evidence of rectal fistula on imaging or exam, I think that repeat EUA under sedation to help assure this is not cause of persistent symptoms is warranted. If no rectal fistula appreciated, I'm hoping there may be a tract or abscess cavity that I can identify to excise.   The patient is amenable to moving forward with scheduling an exam under anesthesia, possible drainage of prior abscess versus excision, and any other indicated procedures. The MRI comments on a Bartholin's gland although I do not appreciate an enlarged gland in the expected area that a Bartholin's cyst would be palpated.   I discussed risks of the procedure including that I may only perform an exam under anesthesia. We discussed that she may have persistence or worsening of current symptoms. We also discussed risks of surgery that include but are not limited to infection; wound separation; injury to adjacent organs; bleeding which may require blood transfusion; anesthesia risk; thromboembolic events; possible death; unforeseen complications; possible need for re-exploration; medical complications such as heart attack, stroke, pleural effusion and pneumonia. The patient will receive DVT and antibiotic prophylaxis as indicated.  She voiced a clear understanding. She had the opportunity to ask questions. Perioperative instructions were reviewed with her.   Plan is for surgery next Wednesday. I will speak with Dr. White who has seen her previously in the outpatient setting prior to the procedure.   A copy of this note was sent to the patient's referring provider.   65 minutes of total time was spent for this patient encounter, including preparation, face-to-face counseling with the patient and coordination of care, and documentation of the encounter.   Yudit Modesitt, MD  Division of Gynecologic Oncology  Department of Obstetrics and Gynecology  [facility]  ___________________________________________  Chief Complaint: Chief Complaint  Patient presents with   Labial abscess    History of Present Illness:  Sabrina Mullins is a 35 y.o. y.o. female who is seen in consultation at the request of Dr. Jertson for an evaluation of persistent vulvar pain and drainage in the setting of a perineal abscess.  The details of her course and treatment are as follows.  Treatment history: February/March 2022: Patient began to feel pain near her anus after bowel movements. Saw GI for her symptoms as she thought pain was related to hemorrhoids, was referred to surgery. 07/26/2020: Seen by Dr. White in the outpatient setting for swelling and pain of her right labia and perineum for the last week, given exam findings of induration and tenderness was referred to the emergency department. 07/26/2020: CT scan showed a 5 cm rim-enhancing fluid collection in the right perineum tracking towards the anus.  Patient noted to have white blood cell count of 13.  Attempts in the emergency department to aspirate the abscess were unsuccessful and ultimately she was taken to the operating room for   I&D of right posterior labial abscess.  Findings at the time of surgery were a large right posterior labial/perineal abscess tracking  superiorly, no confusion with the rectum.  Culture from the abscess showed moderate E. coli, moderate Bacteroides fragilis, moderate Bacteroides ovatus, beta-lactamase positive.  E. coli infection was noted to be pansensitive.  The patient was discharged home on 3/24 after packing was removed.  Discharged on Augmentin. 07/28/2020: Established with a primary care provider.  Hemoglobin A1c at that visit was 5.2%. 07/31/2020: Seen for follow-up at [facility] surgery.  At that time was noted to have a 2 cm area of granulation tissue in the posterior labia with small amount of firmness lateral to this. 08/10/2020: Seen for follow-up at [facility] surgery, additional lancing performed of abscess in the office.  In continued pain, CT obtained. CT of the pelvis showed resolution of perineal abscess collections with minimal residual soft tissue infiltration. The patient reports having significant improvement in her symptoms after finishing 10 days of antibiotics.  Then, after her menses started, she began having perineal pain and discharge again. 09/12/2020: Started on Augmentin. 09/13/2020: Patient seen in the emergency department with worsening rectal pain and drainage.  Labs were unremarkable.  CT scan of the pelvis without contrast did not show fluid collection or abscess.  Recommendation made to continue outpatient antibiotics started the previous day as well as to use warm compresses. 09/18/2020: Saw Dr. Cliffton Asters with [facility] surgery.  Scant yellow discharge was noted draining from the right posterior labia, no erythema or fluctuance was appreciated.  Given persistent symptoms, MRI was recommended.  Patient was also prescribed Flagyl. 10/03/2020: Seen by GYN.  4 mm raised area on the right inner labia described as granulation tissue with slight yellow discharge, very tender with palpation.  Culture taken of her discharge showed many white blood cells, few gram-positive bacilli.  Mix of organisms of  questionable significance recovered on culture and not further identified.  Patient was prescribed Bactrim.  Given persistent symptoms when she called on 6/6, she was prescribed another week course of Bactrim and was also started on Augmentin. 10/12/2020: Saw Dr. Oscar La, continued area of granulation tissue with some yellow discharge noted.  Decision made to refer to infectious disease.  Patient reports that overall her symptoms have improved in the last 2 days on antibiotics.  She now is having some vulvar pain but decreased and her yellow discharge has also decreased.  She is using naproxen as needed for pain.  She has not used any today, but but typically takes it in the morning which helps provide relief until the evening.  Her appetite is somewhat decreased while on antibiotics.  She denies any associated nausea or emesis.  She has not been sleeping well, initially secondary to vulvar pain, but now she is unsure why she wakes up during the night.  She reports regular soft bowel movements.  She intermittently has pain with bowel movements although less so when she has been on antibiotics.  She denies any urinary systems other than pain intermittently when urine hits her vulvar area.  She has been using heating pads and sitz bath's.  PAST MEDICAL HISTORY:  Past Medical History:  Diagnosis Date   Vulvar abscess 2022     PAST SURGICAL HISTORY:  Past Surgical History:  Procedure Laterality Date   CESAREAN SECTION  02/2016   INCISION AND DRAINAGE PERIRECTAL ABSCESS Right 07/26/2020   Procedure: IRRIGATION AND DEBRIDEMENT right labial ABSCESS;  Surgeon: Fritzi Mandes,  MD;  Location: [facility] OR;  Service: General;  Laterality: Right;    OB/GYN HISTORY:  OB History  Gravida Para Term Preterm AB Living  1 1 1     2   SAB IAB Ectopic Multiple Live Births        1 2    # Outcome Date GA Lbr Len/2nd Weight Sex Delivery Anes PTL Lv  1A Term     M CS-LTranv   LIV  1B Term     M CS-LVertical   LIV     Patient's last menstrual period was 09/23/2020.  MEDICATIONS: Outpatient Encounter Medications as of 10/18/2020  Medication Sig   amoxicillin-clavulanate (AUGMENTIN) 875-125 MG tablet Take 1 tablet by mouth 2 (two) times daily.   lidocaine (XYLOCAINE) 5 % ointment Apply 1 application topically 4 (four) times daily as needed.   Multiple Vitamin (MULTIVITAMIN WITH MINERALS) TABS tablet Take 1 tablet by mouth daily.   omega-3 acid ethyl esters (LOVAZA) 1 g capsule Take 1 g by mouth daily.   sulfamethoxazole-trimethoprim (BACTRIM DS) 800-160 MG tablet Take 1 tablet by mouth 2 (two) times daily.   [DISCONTINUED] naproxen (NAPROSYN) 500 MG tablet Take 500 mg by mouth daily as needed.   naproxen (NAPROSYN) 500 MG tablet Take 1 tablet (500 mg total) by mouth daily as needed for moderate pain.   No facility-administered encounter medications on file as of 10/18/2020.    ALLERGIES:  Allergies  Allergen Reactions   Flagyl [Metronidazole] Nausea And Vomiting     FAMILY HISTORY:  Family History  Problem Relation Age of Onset   Healthy Mother    Diabetes Father    Cancer Maternal Grandmother    Breast cancer Maternal Grandmother    Cancer Maternal Grandfather    Lung cancer Maternal Grandfather    Healthy Paternal Grandmother    Heart disease Paternal Grandmother    Healthy Paternal Grandfather    Colon cancer Neg Hx    Ovarian cancer Neg Hx    Endometrial cancer Neg Hx    Pancreatic cancer Neg Hx    Prostate cancer Neg Hx      SOCIAL HISTORY:  Social Connections: Not on file    REVIEW OF SYSTEMS:  + pelvic pain, discharge, swollen gland. Denies appetite changes, fevers, chills, fatigue, unexplained weight changes. Denies hearing loss, neck lumps or masses, mouth sores, ringing in ears or voice changes. Denies cough or wheezing.  Denies shortness of breath. Denies chest pain or palpitations. Denies leg swelling. Denies abdominal distention, pain, blood in stools,  constipation, diarrhea, nausea, vomiting, or early satiety. Denies pain with intercourse, dysuria, frequency, hematuria or incontinence. Denies hot flashes, vaginal bleeding   Denies joint pain, back pain or muscle pain/cramps. Denies itching, rash, or wounds. Denies dizziness, headaches, numbness or seizures. Denies easy bruising or bleeding. Denies anxiety, depression, confusion, or decreased concentration.  Physical Exam:  Vital Signs for this encounter:  Blood pressure 100/67, pulse 79, temperature (!) 97.5 F (36.4 C), resp. rate 20, weight 136 lb 9.6 oz (62 kg), last menstrual period 09/23/2020, SpO2 99 %. Body mass index is 22.73 kg/m. General: Alert, oriented, no acute distress.  HEENT: Normocephalic, atraumatic. Sclera anicteric.  Chest: Clear to auscultation bilaterally. No wheezes, rhonchi, or rales. Cardiovascular: Regular rate and rhythm, no murmurs, rubs, or gallops.  Abdomen: Normoactive bowel sounds. Soft, nondistended, nontender to palpation. No masses or hepatosplenomegaly appreciated. No palpable fluid wave.  Extremities: Grossly normal range of motion. Warm, well perfused. No edema bilaterally.  Skin:  No rashes or lesions.  Lymphatics: No cervical, supraclavicular, or inguinal adenopathy.  GU: Female genitalia notable for small area of granulation tissue (1-65mm) along posterior aspect of right labia majora. No active drainage. Patient is very tender to palpation along right posterior labia and perineum. No obvious area of fluctuance. Single digit vaginal exam with no obvious masses. Rectal exam much less painful, no fistula palpated.  LABORATORY AND RADIOLOGIC DATA:  Outside medical records were reviewed to synthesize the above history, along with the history and physical obtained during the visit.   Lab Results  Component Value Date   WBC 6.7 09/13/2020   HGB 13.5 09/13/2020   HCT 39.9 09/13/2020   PLT 212 09/13/2020   GLUCOSE 97 09/13/2020   CHOL 133  07/28/2020   TRIG 147 07/28/2020   HDL 37 (L) 07/28/2020   LDLCALC 73 07/28/2020   ALT 11 07/28/2020   AST 12 07/28/2020   NA 136 09/13/2020   K 3.6 09/13/2020   CL 106 09/13/2020   CREATININE 0.67 09/13/2020   BUN 9 09/13/2020   CO2 24 09/13/2020   TSH 4.39 07/28/2020   HGBA1C 5.2 07/28/2020   MRI pelvis 5/23: IMPRESSION: Tiny 8 mm focus of focal edema or tiny abscess identified right ischial anal fossa with a bandlike collection of inflammatory tissue tracking anteromedially from this collection to the posterolateral right lower vagina/introitus. Given the appearance of the cystic change on the 07/26/2020 exam (subsequently drained surgically) imaging appearance is felt to be most likely related to residual infectious/inflammatory tissue arising from the region of the right posterior introitus, potentially subsequent to an infected Bartholin's cyst. No MR findings today to suggest definite perirectal/perianal fistula.

## 2020-10-19 ENCOUNTER — Encounter (HOSPITAL_BASED_OUTPATIENT_CLINIC_OR_DEPARTMENT_OTHER): Payer: Self-pay | Admitting: Gynecologic Oncology

## 2020-10-19 ENCOUNTER — Other Ambulatory Visit: Payer: Self-pay | Admitting: Gynecologic Oncology

## 2020-10-19 ENCOUNTER — Other Ambulatory Visit: Payer: Self-pay

## 2020-10-19 DIAGNOSIS — Z973 Presence of spectacles and contact lenses: Secondary | ICD-10-CM

## 2020-10-19 DIAGNOSIS — N764 Abscess of vulva: Secondary | ICD-10-CM

## 2020-10-19 HISTORY — DX: Presence of spectacles and contact lenses: Z97.3

## 2020-10-19 NOTE — Progress Notes (Signed)
Spoke w/ via phone for pre-op interview---pt Lab needs dos----   urine poct COVID test -----patient states asymptomatic no test needed Arrive at -------1045 am 10-25-2020 NPO after MN NO Solid Food.  Clear liquids from MN until---945 am then npo Med rec completed Medications to take morning of surgery -----none Diabetic medication -----n/a Patient instructed no nail polish to be worn day of surgery Patient instructed to bring photo id and insurance card day of surgery Patient aware to have Driver (ride ) / caregiver   spouse Sabrina Mullins spouse will stay  for 24 hours after surgery  Patient Special Instructions -----none Pre-Op special Istructions -----none Patient verbalized understanding of instructions that were given at this phone interview. Patient denies shortness of breath, chest pain, fever, cough at this phone interview.

## 2020-10-23 ENCOUNTER — Other Ambulatory Visit: Payer: Self-pay

## 2020-10-23 ENCOUNTER — Encounter: Payer: Self-pay | Admitting: Gynecologic Oncology

## 2020-10-23 ENCOUNTER — Ambulatory Visit: Payer: 59 | Admitting: Family Medicine

## 2020-10-23 ENCOUNTER — Encounter: Payer: Self-pay | Admitting: Family Medicine

## 2020-10-23 DIAGNOSIS — M542 Cervicalgia: Secondary | ICD-10-CM | POA: Diagnosis not present

## 2020-10-23 DIAGNOSIS — M79602 Pain in left arm: Secondary | ICD-10-CM | POA: Diagnosis not present

## 2020-10-23 NOTE — Progress Notes (Signed)
Office Visit Note   Patient: Sabrina Mullins           Date of Birth: 1985/05/28           MRN: [id number] Visit Date: 10/23/2020 Requested by: Lavada Mesi, MD [address] [place],  [place] [address] PCP: Lavada Mesi, MD  Subjective: Chief Complaint  Patient presents with   Neck - Pain    Started with stiffness in the neck 2 weeks ago. The stiffness is gone, but started having pain down the upper arm and then the rest of the arm. Numbness in the fingers, especially the index finger and thumb. Right-hand dominant. She says she had numbness like this when she was pregnant.     HPI: She is here with neck and left arm pain.  Symptoms started about 6 weeks ago with neck stiffness.  Then she began having pain in the left shoulder and arm, and now she has a constant numbness in the left thumb and index finger.  Pain is not severe but it does not seem to be getting better.  It is worse when she looks upward or turns her head to the left.  She is right-hand dominant.  She has not noticed any weakness in her arm.  She is not taking medication for it.  She has surgery this Wednesday for vulva abscess.                ROS:   All other systems were reviewed and are negative.  Objective: Vital Signs: LMP 09/29/2020   Physical Exam:  General:  Alert and oriented, in no acute distress. Pulm:  Breathing unlabored. Psy:  Normal mood, congruent affect.  Neck: She has full active range of motion.  Spurling's test is equivocal.  She has tenderness to the left of C5-6 in the paraspinous muscles, and a tender trigger point in the left trapezius belly.  Upper extremity strength and reflexes are normal.   Imaging: No results found.  Assessment & Plan: Neck and left arm pain, possibly myofascial but cannot rule out cervical disc protrusion. -She will try capsaicin cream topically.  Once she is adequately recovered from her upcoming surgery, she will start physical therapy at [facility] in  [place].  If still no improvement after that, then cervical x-rays and MRI scan versus nerve conduction studies.     Procedures: No procedures performed        PMFS History: Patient Active Problem List   Diagnosis Date Noted   Gastroesophageal reflux disease 10/03/2020   History of gestational diabetes 07/28/2020   Family history of diabetes mellitus 07/28/2020   Labial abscess 07/26/2020   Abscess 07/26/2020   Past Medical History:  Diagnosis Date   Vulvar abscess 2022   Wears glasses 10/19/2020    Family History  Problem Relation Age of Onset   Healthy Mother    Diabetes Father    Cancer Maternal Grandmother    Breast cancer Maternal Grandmother    Cancer Maternal Grandfather    Lung cancer Maternal Grandfather    Healthy Paternal Grandmother    Heart disease Paternal Grandmother    Healthy Paternal Grandfather    Colon cancer Neg Hx    Ovarian cancer Neg Hx    Endometrial cancer Neg Hx    Pancreatic cancer Neg Hx    Prostate cancer Neg Hx     Past Surgical History:  Procedure Laterality Date   CESAREAN SECTION  02/2016   INCISION AND DRAINAGE PERIRECTAL ABSCESS  Right 07/26/2020   Procedure: IRRIGATION AND DEBRIDEMENT right labial ABSCESS;  Surgeon: Fritzi Mandes, MD;  Location: [facility];  Service: General;  Laterality: Right;   Social History   Occupational History   Occupation: Accountant  Tobacco Use   Smoking status: Never   Smokeless tobacco: Never  Vaping Use   Vaping Use: Never used  Substance and Sexual Activity   Alcohol use: Never   Drug use: Never   Sexual activity: Not Currently    Birth control/protection: Condom

## 2020-10-23 NOTE — Patient Instructions (Signed)
   To rub on shoulder:  Capsaicin cream 0.1%, apply 2-4 times daily as needed

## 2020-10-24 ENCOUNTER — Telehealth: Payer: Self-pay

## 2020-10-24 NOTE — Telephone Encounter (Signed)
Telephone call to check on pre-operative status.  Patient compliant with pre-operative instructions.  Reinforced NPO after midnight.  Patient states she has a small area of tissue next to her incision. She is wondering if Dr. Pricilla Holm will remove this tomorrow. Per Warner Mccreedy, NP Dr. Pricilla Holm will look at it tomorrow and if its concerning it will be removed. Patient verbalized understanding. Instructed to call for any needs.

## 2020-10-25 ENCOUNTER — Ambulatory Visit (HOSPITAL_BASED_OUTPATIENT_CLINIC_OR_DEPARTMENT_OTHER): Payer: 59 | Admitting: Anesthesiology

## 2020-10-25 ENCOUNTER — Encounter (HOSPITAL_BASED_OUTPATIENT_CLINIC_OR_DEPARTMENT_OTHER)
Admission: RE | Disposition: A | Discharge status: Home / Self Care | Payer: Self-pay | Source: Home / Self Care | Attending: Gynecologic Oncology

## 2020-10-25 ENCOUNTER — Telehealth: Payer: Self-pay | Admitting: *Deleted

## 2020-10-25 ENCOUNTER — Encounter (HOSPITAL_BASED_OUTPATIENT_CLINIC_OR_DEPARTMENT_OTHER): Payer: Self-pay | Admitting: Gynecologic Oncology

## 2020-10-25 ENCOUNTER — Ambulatory Visit (HOSPITAL_BASED_OUTPATIENT_CLINIC_OR_DEPARTMENT_OTHER)
Admission: RE | Admit: 2020-10-25 | Discharge: 2020-10-25 | Disposition: A | Discharge status: Home / Self Care | Payer: 59 | Attending: Gynecologic Oncology | Admitting: Gynecologic Oncology

## 2020-10-25 ENCOUNTER — Other Ambulatory Visit: Payer: Self-pay

## 2020-10-25 ENCOUNTER — Encounter: Payer: Self-pay | Admitting: Gynecologic Oncology

## 2020-10-25 DIAGNOSIS — N764 Abscess of vulva: Secondary | ICD-10-CM | POA: Insufficient documentation

## 2020-10-25 DIAGNOSIS — K6289 Other specified diseases of anus and rectum: Secondary | ICD-10-CM | POA: Diagnosis not present

## 2020-10-25 DIAGNOSIS — Z79899 Other long term (current) drug therapy: Secondary | ICD-10-CM | POA: Insufficient documentation

## 2020-10-25 DIAGNOSIS — Z881 Allergy status to other antibiotic agents status: Secondary | ICD-10-CM | POA: Insufficient documentation

## 2020-10-25 DIAGNOSIS — K6139 Other ischiorectal abscess: Secondary | ICD-10-CM | POA: Diagnosis not present

## 2020-10-25 DIAGNOSIS — K219 Gastro-esophageal reflux disease without esophagitis: Secondary | ICD-10-CM | POA: Diagnosis not present

## 2020-10-25 HISTORY — PX: INCISION AND DRAINAGE ABSCESS: SHX5864

## 2020-10-25 LAB — ABO/RH: ABO/RH(D): O POS

## 2020-10-25 LAB — TYPE AND SCREEN
ABO/RH(D): O POS
Antibody Screen: NEGATIVE

## 2020-10-25 LAB — POCT PREGNANCY, URINE: Preg Test, Ur: NEGATIVE

## 2020-10-25 SURGERY — EXAM UNDER ANESTHESIA
Anesthesia: General | Site: Vulva

## 2020-10-25 MED ORDER — ACETAMINOPHEN 500 MG PO TABS
ORAL_TABLET | ORAL | Status: AC
  Filled 2020-10-25: qty 2

## 2020-10-25 MED ORDER — ONDANSETRON HCL 4 MG PO TABS
4.0000 mg | ORAL_TABLET | Freq: Four times a day (QID) | ORAL | Status: DC | PRN
Start: 2020-10-25 — End: 2020-10-25

## 2020-10-25 MED ORDER — ONDANSETRON 4 MG PO TBDP
ORAL_TABLET | ORAL | Status: AC
  Filled 2020-10-25: qty 1

## 2020-10-25 MED ORDER — PHENYLEPHRINE 40 MCG/ML (10ML) SYRINGE FOR IV PUSH (FOR BLOOD PRESSURE SUPPORT)
PREFILLED_SYRINGE | INTRAVENOUS | Status: AC
  Filled 2020-10-25: qty 10

## 2020-10-25 MED ORDER — MIDAZOLAM HCL 5 MG/5ML IJ SOLN
INTRAMUSCULAR | Status: DC | PRN
  Administered 2020-10-25: 2 mg via INTRAVENOUS

## 2020-10-25 MED ORDER — FENTANYL CITRATE (PF) 100 MCG/2ML IJ SOLN
INTRAMUSCULAR | Status: AC
  Filled 2020-10-25: qty 2

## 2020-10-25 MED ORDER — MIDAZOLAM HCL 2 MG/2ML IJ SOLN
INTRAMUSCULAR | Status: AC
  Filled 2020-10-25: qty 2

## 2020-10-25 MED ORDER — LIDOCAINE HCL 1 % IJ SOLN
INTRAMUSCULAR | Status: DC | PRN
  Administered 2020-10-25: 20 mL

## 2020-10-25 MED ORDER — GABAPENTIN 300 MG PO CAPS
ORAL_CAPSULE | ORAL | Status: AC
  Filled 2020-10-25: qty 1

## 2020-10-25 MED ORDER — SENNOSIDES-DOCUSATE SODIUM 8.6-50 MG PO TABS: 2.0000 | ORAL_TABLET | Freq: Every day | ORAL | 1 refills | Status: DC

## 2020-10-25 MED ORDER — ONDANSETRON HCL 4 MG/2ML IJ SOLN
4.0000 mg | Freq: Four times a day (QID) | INTRAMUSCULAR | Status: DC | PRN
Start: 2020-10-25 — End: 2020-10-25

## 2020-10-25 MED ORDER — GABAPENTIN 300 MG PO CAPS
300.0000 mg | ORAL_CAPSULE | ORAL | Status: AC
  Administered 2020-10-25: 300 mg via ORAL

## 2020-10-25 MED ORDER — PROPOFOL 10 MG/ML IV BOLUS
INTRAVENOUS | Status: DC | PRN
  Administered 2020-10-25: 160 mg via INTRAVENOUS

## 2020-10-25 MED ORDER — SCOPOLAMINE 1 MG/3DAYS TD PT72
MEDICATED_PATCH | TRANSDERMAL | Status: AC
  Filled 2020-10-25: qty 1

## 2020-10-25 MED ORDER — DEXAMETHASONE SODIUM PHOSPHATE 10 MG/ML IJ SOLN
INTRAMUSCULAR | Status: AC
  Filled 2020-10-25: qty 1

## 2020-10-25 MED ORDER — OXYCODONE HCL 5 MG PO TABS: 5.0000 mg | ORAL_TABLET | ORAL | 0 refills | Status: DC | PRN

## 2020-10-25 MED ORDER — HYDROGEN PEROXIDE 3 % EX SOLN
CUTANEOUS | Status: DC | PRN
  Administered 2020-10-25: 1

## 2020-10-25 MED ORDER — LIDOCAINE HCL (PF) 2 % IJ SOLN
INTRAMUSCULAR | Status: AC
  Filled 2020-10-25: qty 5

## 2020-10-25 MED ORDER — PROPOFOL 10 MG/ML IV BOLUS
INTRAVENOUS | Status: AC
  Filled 2020-10-25: qty 20

## 2020-10-25 MED ORDER — CEFAZOLIN SODIUM-DEXTROSE 2-4 GM/100ML-% IV SOLN
2.0000 g | INTRAVENOUS | Status: AC
  Administered 2020-10-25: 2 g via INTRAVENOUS

## 2020-10-25 MED ORDER — LACTATED RINGERS IV SOLN: INTRAVENOUS | Status: DC

## 2020-10-25 MED ORDER — SCOPOLAMINE 1 MG/3DAYS TD PT72
1.0000 | MEDICATED_PATCH | TRANSDERMAL | Status: DC
  Administered 2020-10-25: 1.5 mg via TRANSDERMAL

## 2020-10-25 MED ORDER — OXYCODONE HCL 5 MG PO TABS
5.0000 mg | ORAL_TABLET | ORAL | Status: DC | PRN
Start: 2020-10-25 — End: 2020-10-25

## 2020-10-25 MED ORDER — FENTANYL CITRATE (PF) 100 MCG/2ML IJ SOLN
25.0000 ug | INTRAMUSCULAR | Status: DC | PRN
  Administered 2020-10-25 (×3): 50 ug via INTRAVENOUS

## 2020-10-25 MED ORDER — ONDANSETRON HCL 4 MG/2ML IJ SOLN
INTRAMUSCULAR | Status: DC | PRN
  Administered 2020-10-25: 4 mg via INTRAVENOUS

## 2020-10-25 MED ORDER — DEXAMETHASONE SODIUM PHOSPHATE 10 MG/ML IJ SOLN: 4.0000 mg | INTRAMUSCULAR | Status: DC

## 2020-10-25 MED ORDER — FENTANYL CITRATE (PF) 100 MCG/2ML IJ SOLN
INTRAMUSCULAR | Status: DC | PRN
  Administered 2020-10-25 (×3): 50 ug via INTRAVENOUS

## 2020-10-25 MED ORDER — ACETAMINOPHEN 500 MG PO TABS
1000.0000 mg | ORAL_TABLET | ORAL | Status: AC
  Administered 2020-10-25: 1000 mg via ORAL

## 2020-10-25 MED ORDER — ONDANSETRON HCL 4 MG/2ML IJ SOLN
INTRAMUSCULAR | Status: AC
  Filled 2020-10-25: qty 2

## 2020-10-25 MED ORDER — CELECOXIB 200 MG PO CAPS
400.0000 mg | ORAL_CAPSULE | ORAL | Status: AC
  Administered 2020-10-25: 400 mg via ORAL

## 2020-10-25 MED ORDER — CEFAZOLIN SODIUM-DEXTROSE 2-4 GM/100ML-% IV SOLN
INTRAVENOUS | Status: AC
  Filled 2020-10-25: qty 100

## 2020-10-25 MED ORDER — CELECOXIB 200 MG PO CAPS
ORAL_CAPSULE | ORAL | Status: AC
  Filled 2020-10-25: qty 2

## 2020-10-25 MED ORDER — PHENYLEPHRINE 40 MCG/ML (10ML) SYRINGE FOR IV PUSH (FOR BLOOD PRESSURE SUPPORT)
PREFILLED_SYRINGE | INTRAVENOUS | Status: DC | PRN
  Administered 2020-10-25 (×2): 80 ug via INTRAVENOUS

## 2020-10-25 MED ORDER — PROMETHAZINE HCL 25 MG/ML IJ SOLN: 6.2500 mg | INTRAMUSCULAR | Status: DC | PRN

## 2020-10-25 MED ORDER — OXYCODONE HCL 5 MG PO TABS: 5.0000 mg | ORAL_TABLET | Freq: Once | ORAL | Status: DC

## 2020-10-25 MED ORDER — LIDOCAINE 2% (20 MG/ML) 5 ML SYRINGE
INTRAMUSCULAR | Status: DC | PRN
  Administered 2020-10-25: 60 mg via INTRAVENOUS

## 2020-10-25 MED ORDER — DEXAMETHASONE SODIUM PHOSPHATE 10 MG/ML IJ SOLN
INTRAMUSCULAR | Status: DC | PRN
  Administered 2020-10-25: 8 mg via INTRAVENOUS

## 2020-10-25 MED ORDER — TRAMADOL HCL 50 MG PO TABS: 50.0000 mg | ORAL_TABLET | Freq: Four times a day (QID) | ORAL | Status: DC | PRN

## 2020-10-25 SURGICAL SUPPLY — 33 items
BLADE SURG 11 STRL SS (BLADE) IMPLANT
BLADE SURG 15 STRL LF DISP TIS (BLADE) IMPLANT
BLADE SURG 15 STRL SS (BLADE)
CATH ROBINSON RED A/P 16FR (CATHETERS) ×2 IMPLANT
COVER WAND RF STERILE (DRAPES) ×2 IMPLANT
GAUZE 4X4 16PLY RFD (DISPOSABLE) ×6 IMPLANT
GLOVE SURG ENC MOIS LTX SZ6 (GLOVE) ×4 IMPLANT
GLOVE SURG ENC MOIS LTX SZ6.5 (GLOVE) ×4 IMPLANT
GLOVE SURG POLYISO LF SZ6 (GLOVE) ×8 IMPLANT
GLOVE SURG UNDER POLY LF SZ6.5 (GLOVE) ×4 IMPLANT
GOWN STRL REUS W/TWL LRG LVL3 (GOWN DISPOSABLE) ×8 IMPLANT
IV CATH 18G SAFETY (IV SOLUTION) ×2 IMPLANT
IV CATH PLACEMENT 22GA SAFETY (IV SOLUTION) ×2 IMPLANT
KIT TURNOVER CYSTO (KITS) ×2 IMPLANT
NDL SAFETY ECLIPSE 18X1.5 (NEEDLE) ×1 IMPLANT
NEEDLE HYPO 18GX1.5 SHARP (NEEDLE) ×2
NEEDLE HYPO 23GX1 LL BLUE HUB (NEEDLE) ×2 IMPLANT
NEEDLE HYPO 25X1 1.5 SAFETY (NEEDLE) ×2 IMPLANT
NS IRRIG 500ML POUR BTL (IV SOLUTION) ×2 IMPLANT
PACK PERINEAL COLD (PAD) ×2 IMPLANT
PACK VAGINAL WOMENS (CUSTOM PROCEDURE TRAY) ×2 IMPLANT
SHEARS HARMONIC 9CM CVD (BLADE) ×2 IMPLANT
SUT VIC AB 2-0 SH 27 (SUTURE) ×14
SUT VIC AB 2-0 SH 27X BRD (SUTURE) ×3 IMPLANT
SUT VIC AB 2-0 SH 27XBRD (SUTURE) ×4 IMPLANT
SUT VIC AB 3-0 SH 27 (SUTURE) ×8
SUT VIC AB 3-0 SH 27X BRD (SUTURE) ×4 IMPLANT
SUT VIC AB 4-0 SH 27 (SUTURE) ×4
SUT VIC AB 4-0 SH 27XANBCTRL (SUTURE) ×2 IMPLANT
SYR BULB IRRIG 60ML STRL (SYRINGE) ×2 IMPLANT
SYR CONTROL 10ML LL (SYRINGE) ×2 IMPLANT
TOWEL OR 17X26 10 PK STRL BLUE (TOWEL DISPOSABLE) ×2 IMPLANT
WATER STERILE IRR 500ML POUR (IV SOLUTION) ×2 IMPLANT

## 2020-10-25 NOTE — Anesthesia Preprocedure Evaluation (Signed)
Anesthesia Evaluation  Patient identified by MRN, date of birth, ID band Patient awake    Reviewed: Allergy & Precautions, NPO status , Patient's Chart, lab work & pertinent test results  Airway Mallampati: II  TM Distance: >3 FB Neck ROM: Full    Dental  (+) Teeth Intact   Pulmonary neg pulmonary ROS,    Pulmonary exam normal        Cardiovascular negative cardio ROS   Rhythm:Regular Rate:Normal     Neuro/Psych negative neurological ROS  negative psych ROS   GI/Hepatic Neg liver ROS, GERD  ,  Endo/Other  negative endocrine ROS  Renal/GU negative Renal ROS  Female GU complaint     Musculoskeletal negative musculoskeletal ROS (+)   Abdominal (+)  Abdomen: soft. Bowel sounds: normal.  Peds  Hematology negative hematology ROS (+)   Anesthesia Other Findings   Reproductive/Obstetrics                             Anesthesia Physical Anesthesia Plan  ASA: 2  Anesthesia Plan: General   Post-op Pain Management:    Induction: Intravenous  PONV Risk Score and Plan: 3 and Ondansetron, Dexamethasone, Midazolam and Treatment may vary due to age or medical condition  Airway Management Planned: Mask and LMA  Additional Equipment: None  Intra-op Plan:   Post-operative Plan: Extubation in OR  Informed Consent: I have reviewed the patients History and Physical, chart, labs and discussed the procedure including the risks, benefits and alternatives for the proposed anesthesia with the patient or authorized representative who has indicated his/her understanding and acceptance.     Dental advisory given  Plan Discussed with: CRNA  Anesthesia Plan Comments: (Lab Results      Component                Value               Date                      WBC                      6.7                 09/13/2020                HGB                      13.5                09/13/2020                 HCT                      39.9                09/13/2020                MCV                      89.5                09/13/2020                PLT                      212  09/13/2020           Lab Results      Component                Value               Date                      NA                       136                 09/13/2020                K                        3.6                 09/13/2020                CO2                      24                  09/13/2020                GLUCOSE                  97                  09/13/2020                BUN                      9                   09/13/2020                CREATININE               0.67                09/13/2020                CALCIUM                  9.0                 09/13/2020                GFRNONAA                 >60                 09/13/2020          )        Anesthesia Quick Evaluation

## 2020-10-25 NOTE — Telephone Encounter (Signed)
Notified the patient per her my chart message per Melissa APP "ok to have surgery today with her mensual cycle starting.

## 2020-10-25 NOTE — Op Note (Signed)
PATIENT: Lorelei Pont DATE: 10/25/20  Preop Diagnosis: Ischiorectal/vulvar abscess, previously I&D'ed, with continued drainage and pain  Postoperative Diagnosis: same as above  Surgery: Partial right radical vulvectomy and excision of vulvar/ichiorectal abscess tract  Surgeons:  Carin Hock MD  Assistant: Warner Mccreedy NP  Anesthesia: General   Estimated blood loss: 350 ml  IVF:  see I&O flowsheet   Urine output: 60 ml   Complications: None apparent  Pathology: Ischiorectal/vulvar abscess tract  Operative findings: 3-104mm defect noted just at the vaginal introitus between 7-8 o'clock with induration tract appreciated from this location to the peri-anal/ischiorectal space on the right. Rectum uninvolved. What is suspected to be cavity of prior abscess is approximately 3x4cm. On initial exam and prior to incision 18 gauge needle used to inject a dilute hydrogen peroxide solution into the abscess cavity. No bubbles evident in anus or rectum, no visual or palpable evidence of fistula to rectum. Abscess cavity within the ischioanal/ischiorectal fossa, abuttig the external anal sphincter. No palpable area of induration at completion of surgery.  Procedure: The patient was identified in the preoperative holding area. Informed consent was signed on the chart. Patient was seen history was reviewed and exam was performed.   The patient was then taken to the operating room and placed in the supine position with SCD hose on. General anesthesia was then induced without difficulty. She was then placed in the dorsolithotomy position. The perineum was prepped with Betadine. The vagina was prepped with Betadine. The patient was then draped after the prep was dried. A Foley catheter was inserted into the bladder under sterile conditions.  Timeout was performed the patient, procedure, antibiotic, allergy, and length of procedure.   On two separate occasions, a dilute hydrogen peroxide solution  was injected into the palpable abscess tract or cavity to rule out fistula with the rectum. An incision was then made over the previous site of incision at the time of drainage, Paralleling the vaginal opening. The tract was then grasped with hemostats and a combination of blunt dissection and electrocautery was used to carry the dissection posteriorly and inferiorly, frequently performing a rectal exam to assure no unintentional injury to the rectum. Gloves were changed each time a rectal exam was performed. Ultimately, a mediolateral incision had to be made from the midpoint of the initial incision laterally. Slowly, using a combination of blunt dissection and sharp dissection, the abscess cavity was excised from surrounding normal adipose tissue. Once freed, the specimen was handed off the field.  Monopolar electrocautery, handheld bipolar electrocautery, and figure of eight stitches using 2-0 Vicryl were then used to achieve hemostasis of the surgical bed. The bed was then copiously irrigated and hemostasis assured. The deeper subcutaneous layers were closed with mattress sutures with 2-0 Vicryl. The more superficial layers were closed with 3-0 Vicryl using the same approach. The skin was closed with vertical mattress stitches with 4-0 Vicryl suture.   The perineum was again irrigated. The foley was removed. 20cc of 1% lidocaine was injected for local anesthesia.   All instrument, suture, laparotomy, Ray-Tec, and needle counts were correct x2. Bladder was drained with a red rubber catheter.  The patient tolerated the procedure well and was taken recovery room in stable condition.   Eugene Garnet MD Gynecologic Oncology

## 2020-10-25 NOTE — Anesthesia Procedure Notes (Signed)
Procedure Name: LMA Insertion Date/Time: 10/25/2020 1:10 PM Performed by: Briant Sites, CRNA Pre-anesthesia Checklist: Patient identified, Emergency Drugs available, Suction available and Patient being monitored Patient Re-evaluated:Patient Re-evaluated prior to induction Oxygen Delivery Method: Circle system utilized Preoxygenation: Pre-oxygenation with 100% oxygen Induction Type: IV induction Ventilation: Mask ventilation without difficulty LMA: LMA inserted LMA Size: 4.0 Number of attempts: 1 Airway Equipment and Method: Bite block Placement Confirmation: positive ETCO2 Tube secured with: Tape Dental Injury: Teeth and Oropharynx as per pre-operative assessment

## 2020-10-25 NOTE — Interval H&P Note (Signed)
History and Physical Interval Note:  10/25/2020 12:27 PM  Sabrina Mullins  has presented today for surgery, with the diagnosis of VULVAR PERIRECTAL ABSCESS.  The various methods of treatment have been discussed with the patient and family. After consideration of risks, benefits and other options for treatment, the patient has consented to  Procedure(s): EXAM UNDER ANESTHESIA (N/A) POSSIBLE INCISION AND DRAINAGE OF THE VULVA AND ABSCESS TRACT EXCISION (N/A) as a surgical intervention.  The patient's history has been reviewed, patient examined, no change in status, stable for surgery.  I have reviewed the patient's chart and labs.  Questions were answered to the patient's satisfaction.     Carver Fila

## 2020-10-25 NOTE — Brief Op Note (Signed)
10/25/2020  3:48 PM  PATIENT:  Sabrina Mullins  35 y.o. female  PRE-OPERATIVE DIAGNOSIS:  VULVAR PERIRECTAL ABSCESS  POST-OPERATIVE DIAGNOSIS:  VULVAR PERIRECTAL ABSCESS  PROCEDURE:  Procedure(s): EXAM UNDER ANESTHESIA (N/A) VULVA  ABSCESS TRACT EXCISION (N/A)  SURGEON:  Surgeon(s) and Role:    * Carver Fila, MD - Primary  ASSISTANTS: Warner Mccreedy NP   ANESTHESIA:   general  EBL:  350 mL   BLOOD ADMINISTERED:none  DRAINS: none   LOCAL MEDICATIONS USED:  LIDOCAINE   SPECIMEN:  peri-anal abscess tract  DISPOSITION OF SPECIMEN:  PATHOLOGY  COUNTS:  YES  TOURNIQUET:  * No tourniquets in log *  DICTATION: .Note written in EPIC  PLAN OF CARE: Discharge to home after PACU  PATIENT DISPOSITION:  PACU - hemodynamically stable.   Delay start of Pharmacological VTE agent (>24hrs) due to surgical blood loss or risk of bleeding: not applicable

## 2020-10-25 NOTE — Discharge Instructions (Addendum)
10/25/2020  Return to work: 4-6 weeks if applicable  You can continue taking the naproxen as prescribed.  We recommend purchasing several bags of frozen green peas and dividing them into ziploc bags. You will want to keep these in the freezer and have them ready to use as ice packs to the vulvar incision. Once the ice pack is no longer cold, you can get another from the freezer. The frozen peas mold to your body better than a regular ice pack.   You will need to use a peri bottle to rinse the incision after urinating and having a bowel movement and pat the incision dry. You will need to take the medication, sennakot-S, that was sent to your pharmacy and take every night to keep your bowel movements very soft to prevent straining.   There is a chance the incision may open, meaning the stitches may come apart. In this case, the wound will heal from the inside out. If this happens, please call the office.  Activity: 1. Be up and out of the bed during the day.  Take a nap if needed.  You may walk up steps but be careful and use the hand rail.  Stair climbing will tire you more than you think, you may need to stop part way and rest.   2. No lifting or straining, pushing, pulling for 6 weeks.  3. No driving for 1 week(s).  Do not drive if you are taking narcotic pain medicine.  4. Shower daily.  Use soap and water on your incision and pat dry; don't rub.  No tub baths until cleared by your surgeon.   5. No sexual activity and nothing in the vagina for 4 weeks.  6. You may experience bleeding and drainage from the incision.  The spotting is normal but if you experience heavy bleeding, call our office.  7. Take Tylenol or ibuprofen first for pain and only use narcotic pain medication for severe pain not relieved by the Tylenol or Ibuprofen.  Monitor your Tylenol intake to a max of 4,000 mg.  Diet: 1. Low sodium Heart Healthy Diet is recommended.  2. It is safe to use a laxative, such as Miralax  or Colace, if you have difficulty moving your bowels. You can take Sennakot at bedtime every evening to keep bowel movements regular and to prevent constipation.    Wound Care: 1. Keep clean and dry.  Shower daily.  Reasons to call the Doctor: Fever - Oral temperature greater than 100.4 degrees Fahrenheit Foul-smelling vaginal discharge Difficulty urinating Nausea and vomiting Increased pain at the site of the incision that is unrelieved with pain medicine. Difficulty breathing with or without chest pain New calf pain especially if only on one side Sudden, continuing increased vaginal bleeding with or without clots.   Contacts: For questions or concerns you should contact:  Dr. Eugene Garnet at [phone]  Warner Mccreedy, NP at [phone]  After Hours: call [phone] and have the GYN Oncologist paged/contacted    Post Anesthesia Home Care Instructions  Activity: Get plenty of rest for the remainder of the day. A responsible individual must stay with you for 24 hours following the procedure.  For the next 24 hours, DO NOT: -Drive a car -Advertising copywriter -Drink alcoholic beverages -Take any medication unless instructed by your physician -Make any legal decisions or sign important papers.  Meals: Start with liquid foods such as gelatin or soup. Progress to regular foods as tolerated. Avoid greasy, spicy, heavy foods. If  nausea and/or vomiting occur, drink only clear liquids until the nausea and/or vomiting subsides. Call your physician if vomiting continues.  Special Instructions/Symptoms: Your throat may feel dry or sore from the anesthesia or the breathing tube placed in your throat during surgery. If this causes discomfort, gargle with warm salt water. The discomfort should disappear within 24 hours.  If you had a scopolamine patch placed behind your ear for the management of post- operative nausea and/or vomiting:  1. The medication in the patch is effective for  72 hours, after which it should be removed.  Wrap patch in a tissue and discard in the trash. Wash hands thoroughly with soap and water. 2. You may remove the patch earlier than 72 hours if you experience unpleasant side effects which may include dry mouth, dizziness or visual disturbances. 3. Avoid touching the patch. Wash your hands with soap and water after contact with the patch.  **No Tylenol, acetaminophen, ibuprofen, Advil, Motrin or naproxen until 6pm today if needed.

## 2020-10-25 NOTE — Transfer of Care (Signed)
Immediate Anesthesia Transfer of Care Note  Patient: Sabrina Mullins  Procedure(s) Performed: Francia Greaves UNDER ANESTHESIA (Vulva) VULVA  ABSCESS TRACT EXCISION (Vulva)  Patient Location: PACU  Anesthesia Type:General  Level of Consciousness: drowsy, patient cooperative and responds to stimulation  Airway & Oxygen Therapy: Patient Spontanous Breathing  Post-op Assessment: Report given to RN and Post -op Vital signs reviewed and stable  Post vital signs: Reviewed and stable  Last Vitals:  Vitals Value Taken Time  BP 110/63 10/25/20 1533  Temp 36.4 C 10/25/20 1533  Pulse 69 10/25/20 1538  Resp 11 10/25/20 1538  SpO2 98 % 10/25/20 1538  Vitals shown include unvalidated device data.  Last Pain:  Vitals:   10/25/20 1533  PainSc: Asleep         Complications: No notable events documented.

## 2020-10-26 ENCOUNTER — Telehealth: Payer: Self-pay

## 2020-10-26 ENCOUNTER — Encounter: Payer: Self-pay | Admitting: Gynecologic Oncology

## 2020-10-26 LAB — SURGICAL PATHOLOGY

## 2020-10-26 NOTE — Telephone Encounter (Signed)
Spoke with Ms. Gose this morning. She states she is eating, drinking and urinating well. She has not had a BM yet but is passing gas. She is using peri bottle when using the bathroom. She states she is having some nausea and dizziness. Per Warner Mccreedy, NP remove the scopolamine patch and drink plenty of water. She denies fever or chills. Her pain is controlled with Naprosyn and frozen peas applied to surgical site.   Patient wishes to return to work next week. Per NP the concern is putting too much pressure from sitting on the surgical site. Patient verbalized understanding and will request to work from home. Instructed to call if she needs a work note.   Instructed to call office with any fever, chills, purulent drainage, uncontrolled pain or any other questions or concerns. Patient verbalizes understanding.   Pt aware of post op appointments as well as the office number [phone] and after hours number [phone] to call if she has any questions or concerns

## 2020-10-26 NOTE — Progress Notes (Signed)
I called the patient, reviewed pathology findings from yesterday. She is overall doing well and pain has improved some from yesterday. All questions answered.  Sabrina Hahn TuckerMD

## 2020-10-26 NOTE — Anesthesia Postprocedure Evaluation (Signed)
Anesthesia Post Note  Patient: Sabrina Mullins  Procedure(s) Performed: Francia Greaves UNDER ANESTHESIA (Vulva) VULVA  ABSCESS TRACT EXCISION (Vulva)     Patient location during evaluation: PACU Anesthesia Type: General Level of consciousness: awake and alert Pain management: pain level controlled Vital Signs Assessment: post-procedure vital signs reviewed and stable Respiratory status: spontaneous breathing, nonlabored ventilation, respiratory function stable and patient connected to nasal cannula oxygen Cardiovascular status: blood pressure returned to baseline and stable Postop Assessment: no apparent nausea or vomiting Anesthetic complications: no   No notable events documented.  Last Vitals:  Vitals:   10/25/20 1657 10/25/20 1749  BP: 104/62 100/63  Pulse: (!) 57 (!) 53  Resp: 12 14  Temp: 36.5 C (!) 36.3 C  SpO2: 100% 100%    Last Pain:  Vitals:   10/25/20 1749  PainSc: 5                  Trisa Cranor P Silvie Obremski

## 2020-10-27 ENCOUNTER — Encounter: Payer: Self-pay | Admitting: Gynecologic Oncology

## 2020-10-27 ENCOUNTER — Telehealth: Payer: Self-pay

## 2020-10-27 ENCOUNTER — Encounter (HOSPITAL_BASED_OUTPATIENT_CLINIC_OR_DEPARTMENT_OTHER): Payer: Self-pay | Admitting: Gynecologic Oncology

## 2020-10-27 NOTE — Telephone Encounter (Signed)
Spoke with Sabrina Mullins regarding her [employer] requesting a work note. She would like to go back to work on July 5th. Work note has been completed and emailed to her as requested to [email or web address].   Instructed to call if she needs another work note based on her recovery. Patient verbalized understanding. She will call if she has questions or concerns.

## 2020-10-29 DIAGNOSIS — R5383 Other fatigue: Secondary | ICD-10-CM | POA: Diagnosis not present

## 2020-10-29 DIAGNOSIS — M791 Myalgia, unspecified site: Secondary | ICD-10-CM | POA: Diagnosis not present

## 2020-10-29 DIAGNOSIS — Z20822 Contact with and (suspected) exposure to covid-19: Secondary | ICD-10-CM | POA: Diagnosis not present

## 2020-10-29 DIAGNOSIS — R6883 Chills (without fever): Secondary | ICD-10-CM | POA: Diagnosis not present

## 2020-10-29 DIAGNOSIS — R110 Nausea: Secondary | ICD-10-CM | POA: Diagnosis not present

## 2020-10-30 ENCOUNTER — Other Ambulatory Visit: Payer: Self-pay

## 2020-10-30 ENCOUNTER — Other Ambulatory Visit (HOSPITAL_COMMUNITY): Payer: 59

## 2020-10-30 ENCOUNTER — Encounter: Payer: Self-pay | Admitting: Gynecologic Oncology

## 2020-10-30 ENCOUNTER — Inpatient Hospital Stay (HOSPITAL_BASED_OUTPATIENT_CLINIC_OR_DEPARTMENT_OTHER): Payer: 59 | Admitting: Gynecologic Oncology

## 2020-10-30 ENCOUNTER — Inpatient Hospital Stay: Payer: 59

## 2020-10-30 ENCOUNTER — Telehealth: Payer: Self-pay

## 2020-10-30 VITALS — BP 105/73 | HR 92 | Temp 98.4°F | Resp 18 | Ht 65.0 in | Wt 138.9 lb

## 2020-10-30 DIAGNOSIS — L0291 Cutaneous abscess, unspecified: Secondary | ICD-10-CM

## 2020-10-30 DIAGNOSIS — N764 Abscess of vulva: Secondary | ICD-10-CM

## 2020-10-30 DIAGNOSIS — Z9889 Other specified postprocedural states: Secondary | ICD-10-CM

## 2020-10-30 LAB — COMPREHENSIVE METABOLIC PANEL
ALT: 9 U/L (ref 0–44)
AST: 11 U/L — ABNORMAL LOW (ref 15–41)
Albumin: 3.9 g/dL (ref 3.5–5.0)
Alkaline Phosphatase: 68 U/L (ref 38–126)
Anion gap: 10 (ref 5–15)
BUN: 9 mg/dL (ref 6–20)
CO2: 26 mmol/L (ref 22–32)
Calcium: 9.3 mg/dL (ref 8.9–10.3)
Chloride: 104 mmol/L (ref 98–111)
Creatinine, Ser: 0.89 mg/dL (ref 0.44–1.00)
GFR, Estimated: >60 mL/min (ref >60)
Glucose, Bld: 94 mg/dL (ref 70–99)
Potassium: 3.9 mmol/L (ref 3.5–5.1)
Sodium: 140 mmol/L (ref 135–145)
Total Bilirubin: 0.3 mg/dL (ref 0.3–1.2)
Total Protein: 7.4 g/dL (ref 6.5–8.1)

## 2020-10-30 LAB — CBC WITH DIFFERENTIAL (CANCER CENTER ONLY)
Abs Immature Granulocytes: 0.05 10*3/uL (ref 0.00–0.07)
Basophils Absolute: 0 10*3/uL (ref 0.0–0.1)
Basophils Relative: 0 %
Eosinophils Absolute: 0.1 10*3/uL (ref 0.0–0.5)
Eosinophils Relative: 1 %
HCT: 33.6 % — ABNORMAL LOW (ref 36.0–46.0)
Hemoglobin: 11.7 g/dL — ABNORMAL LOW (ref 12.0–15.0)
Immature Granulocytes: 0 %
Lymphocytes Relative: 13 %
Lymphs Abs: 1.6 10*3/uL (ref 0.7–4.0)
MCH: 30.5 pg (ref 26.0–34.0)
MCHC: 34.8 g/dL (ref 30.0–36.0)
MCV: 87.7 fL (ref 80.0–100.0)
Monocytes Absolute: 0.7 10*3/uL (ref 0.1–1.0)
Monocytes Relative: 6 %
Neutro Abs: 10.4 10*3/uL — ABNORMAL HIGH (ref 1.7–7.7)
Neutrophils Relative %: 80 %
Platelet Count: 208 10*3/uL (ref 150–400)
RBC: 3.83 MIL/uL — ABNORMAL LOW (ref 3.87–5.11)
RDW: 11.9 % (ref 11.5–15.5)
WBC Count: 12.9 10*3/uL — ABNORMAL HIGH (ref 4.0–10.5)
nRBC: 0 % (ref 0.0–0.2)

## 2020-10-30 NOTE — Telephone Encounter (Signed)
Spoke with Sabrina Mullins this morning, she called twice over the weekend and went to urgent care Sunday 10/29/20.  Pt. States Friday night she was cold but temp was normal. She had a HA, no energy, dizziness, nausea and muscle soreness. She was taking naproxen for the discomfort. Saturday symptoms persisted and she had yellow/white, bloody discharge coming from the incision. Her stools were loose, she did take senokot.  Pt. Went to urgent care Sunday, both flu and covid tests were negative. They prescribed Sulfamethoxazole and tylenol in conjunction with the naproxen.  She rates her pain as a 3, feels like pins and needles and is about to take naproxen. Her temp this morning is 96.8 (forehead). She states she doesn't feel well.

## 2020-10-30 NOTE — Telephone Encounter (Signed)
Notified Linde that Dr. Pricilla Holm would like to see her today and check her labs. She is scheduled for a 2pm lab appointment and 2:30pm appointment with Dr. Pricilla Holm. Patient verbalizes understanding and is in agreement.

## 2020-10-30 NOTE — Patient Instructions (Addendum)
There is an infection in the area from your surgery last week. I opened part of your incision and drained all of the pus that I could see or feel. I washed out the incision and packed it.   Dr. Pricilla Holm wants to see you in the office tomorrow to re-evaluate the area and see if imaging or a procedure in the operating room is needed.   If you start feeling worse (fever, symptoms worsen), you need to go to the Emergency Room for further evaluation.   You need to continue taking your antibiotic as prescribed to treat the infection.

## 2020-10-30 NOTE — Progress Notes (Addendum)
Gynecologic Oncology Return Clinic Visit  10/30/2020  Reason for Visit: Postop malaise, drainage  Treatment History: 10/25/2020: Underwent excision of abscess tract in the ischio rectal/ischioanal fossa  Interval History: Patient reports that she was feeling well after surgery until Friday night.  She then developed chills, headache, sore muscles, and intermittent nausea.  She called several times over the weekend to the answering service and was told to come to urgent care if she did not feel better.  She was seen on Sunday and tested negative for the flu and COVID.  At that time she was not having any increasing vulvar pain or drainage.  Her incision was not felt to be infected on exam.  Given postoperative state, she was given Bactrim in the event that she did have a wound infection.  She started the antibiotics Sunday afternoon and has taken 3 doses.  She thinks she feels a little better since starting them.  She endorses discharge that started last night which has looked somewhat bloody and at times yellow.  She denies any pain currently.  She has taken Tylenol once and naproxen once today.  Her temperature over the last couple of days at home has at most been 99.2 F.  She endorses some dizziness intermittently starting Friday.  She denies any cough, congestion, shortness of breath.  She endorses a good appetite without any episodes of emesis.  She had a bowel movement on Friday and 1 on Sunday.  She is using an oral bowel regimen and used a small amount of a Fleet enema once on Saturday.  She describes both bowel movements as liquid stool.  She denies any urinary symptoms.  Past Medical/Surgical History: Past Medical History:  Diagnosis Date   Vulvar abscess 2022   Wears glasses 10/19/2020    Past Surgical History:  Procedure Laterality Date   CESAREAN SECTION  02/2016   INCISION AND DRAINAGE ABSCESS N/A 10/25/2020   Procedure: VULVA  ABSCESS TRACT EXCISION;  Surgeon: Carver Fila,  MD;  Location: [facility];  Service: Gynecology;  Laterality: N/A;   INCISION AND DRAINAGE PERIRECTAL ABSCESS Right 07/26/2020   Procedure: IRRIGATION AND DEBRIDEMENT right labial ABSCESS;  Surgeon: Fritzi Mandes, MD;  Location: [facility] OR;  Service: General;  Laterality: Right;    Family History  Problem Relation Age of Onset   Healthy Mother    Diabetes Father    Cancer Maternal Grandmother    Breast cancer Maternal Grandmother    Cancer Maternal Grandfather    Lung cancer Maternal Grandfather    Healthy Paternal Grandmother    Heart disease Paternal Grandmother    Healthy Paternal Grandfather    Colon cancer Neg Hx    Ovarian cancer Neg Hx    Endometrial cancer Neg Hx    Pancreatic cancer Neg Hx    Prostate cancer Neg Hx     Social History   Socioeconomic History   Marital status: Married    Spouse name: Not on file   Number of children: Not on file   Years of education: Not on file   Highest education level: Not on file  Occupational History   Occupation: Airline pilot  Tobacco Use   Smoking status: Never   Smokeless tobacco: Never  Vaping Use   Vaping Use: Never used  Substance and Sexual Activity   Alcohol use: Never   Drug use: Never   Sexual activity: Not Currently    Birth control/protection: Condom  Other Topics Concern   Not  on file  Social History Narrative   Not on file   Social Determinants of Health   Financial Resource Strain: Not on file  Food Insecurity: Not on file  Transportation Needs: Not on file  Physical Activity: Not on file  Stress: Not on file  Social Connections: Not on file    Current Medications:  Current Outpatient Medications:    naproxen (NAPROSYN) 500 MG tablet, Take 1 tablet (500 mg total) by mouth daily as needed for moderate pain. (Patient taking differently: Take 500 mg by mouth daily.), Disp: 30 tablet, Rfl: 1   senna-docusate (SENOKOT-S) 8.6-50 MG tablet, Take 2 tablets by mouth at bedtime. For AFTER  surgery, do not take if having diarrhea, Disp: 60 tablet, Rfl: 1   sulfamethoxazole-trimethoprim (BACTRIM DS) 800-160 MG tablet, Take 1 tablet by mouth 2 (two) times daily., Disp: , Rfl:    Multiple Vitamin (MULTIVITAMIN WITH MINERALS) TABS tablet, Take 1 tablet by mouth daily., Disp: , Rfl:    omega-3 acid ethyl esters (LOVAZA) 1 g capsule, Take 1 g by mouth daily., Disp: , Rfl:    ondansetron (ZOFRAN-ODT) 4 MG disintegrating tablet, Take 4 mg by mouth every 8 (eight) hours as needed. (Patient not taking: No sig reported), Disp: , Rfl:    oxyCODONE (OXY IR/ROXICODONE) 5 MG immediate release tablet, Take 1 tablet (5 mg total) by mouth every 4 (four) hours as needed for severe pain. Do not take and drive (Patient not taking: No sig reported), Disp: 15 tablet, Rfl: 0  Review of Systems: Pertinent positives as per HPI. Denies appetite changes, fevers, unexplained weight changes. Denies hearing loss, neck lumps or masses, mouth sores, ringing in ears or voice changes. Denies cough or wheezing.  Denies shortness of breath. Denies chest pain or palpitations. Denies leg swelling. Denies abdominal distention, pain, blood in stools, constipation, vomiting, or early satiety. Denies pain with intercourse, dysuria, frequency, hematuria or incontinence. Denies hot flashes.   Denies joint pain, back pain. Denies itching, rash. Denies seizures. Denies swollen lymph nodes or glands, denies easy bruising or bleeding. Denies anxiety, depression, confusion, or decreased concentration.  Physical Exam: BP 105/73 (BP Location: Left Arm, Patient Position: Standing)   Pulse 92   Temp 98.4 F (36.9 C) (Oral)   Resp 18   Ht 5\' 5"  (1.651 m)   Wt 138 lb 14.4 oz (63 kg)   SpO2 99%   BMI 23.11 kg/m  General: Alert, oriented, no acute distress. HEENT: Cephalic, atraumatic, sclera anicteric. Chest: Clear to auscultation bilaterally.  No wheezes or rhonchi. Cardiovascular: Regular rate and rhythm, no  murmurs. Abdomen: soft, nontender.  Normoactive bowel sounds.  No masses or hepatosplenomegaly appreciated.   Extremities: Grossly normal range of motion.  Warm, well perfused.  No edema bilaterally. Skin: No rashes or lesions noted. GU: External female genitalia notable for some bruising and discoloration of the right labia and right outer buttocks.  There is blanching erythema around the incision, mostly on the posterior aspect that extends towards the gluteal cleft.  Patient has some tenderness with palpation in this area.  She has minimal tenderness with palpation along the incision itself and notes that this is somewhat numb.  There is some serosanguineous drainage at first although some purulent drainage noticed with palpation of the surrounding indurated tissue. Rectal exam with no obvious evidence of fistula.  Given exam findings and concern for postoperative infection, I discussed with patient my recommendation to remove several sutures along the larger aspect of the incision.  2 cc of 1% lidocaine was injected for local anesthesia after the patient gave verbal consent and the tissue was cleansed with Betadine x3.  The incision was then opened along an approximately 4 cm area using pickups and scissors.  Tissue was probed bluntly both with Q-tips as well as my finger extending that tracked superiorly approximately 4 cm and anteriorly several centimeters.  Significant amount of pus was extracted.  Once the incision was probed without any evidence of additional areas of purulent drainage and healthy tissue noted, the wound was irrigated copiously and then packed with quarter inch iodoform packing.  Patient overall tolerated the procedure well.  Laboratory & Radiologic Studies: CBC    Component Value Date/Time   WBC 12.9 (H) 10/30/2020 1409   WBC 6.7 09/13/2020 1941   RBC 3.83 (L) 10/30/2020 1409   HGB 11.7 (L) 10/30/2020 1409   HCT 33.6 (L) 10/30/2020 1409   PLT 208 10/30/2020 1409   MCV  87.7 10/30/2020 1409   MCH 30.5 10/30/2020 1409   MCHC 34.8 10/30/2020 1409   RDW 11.9 10/30/2020 1409   LYMPHSABS 1.6 10/30/2020 1409   MONOABS 0.7 10/30/2020 1409   EOSABS 0.1 10/30/2020 1409   BASOSABS 0.0 10/30/2020 1409   BMET    Component Value Date/Time   NA 140 10/30/2020 1409   K 3.9 10/30/2020 1409   CL 104 10/30/2020 1409   CO2 26 10/30/2020 1409   GLUCOSE 94 10/30/2020 1409   BUN 9 10/30/2020 1409   CREATININE 0.89 10/30/2020 1409   CREATININE 0.70 07/28/2020 0929   CALCIUM 9.3 10/30/2020 1409   GFRNONAA >60 10/30/2020 1409    Assessment & Plan: Sabrina Mullins is a 35 y.o. woman who is status post excision of a ischio anal/ischio rectal abscess tract last week presenting with postoperative wound infection.  Discussed findings on my exam today.  The patient has had some clinical improvement since starting antibiotics.  She has been on antibiotics for less than a day.  I opened her incision and drained all of the indurated areas that contain purulence that I could appreciate.  She tolerated this remarkably well.  Packing was placed and we discussed plan for close follow-up with an exam and repacking tomorrow.  I gave the patient strict precautions that if she has any worsening of her symptoms she needs to present to the emergency department tonight for imaging and consideration of further incision and drainage in the operative setting.  Cultures were sent today to assure that Bactrim will cover the source.  The patient is aware of her follow-up appointment tomorrow afternoon and will call me if anything changes.  26 minutes of total time was spent for this patient encounter, including preparation, face-to-face counseling with the patient and coordination of care, and documentation of the encounter.  Eugene Garnet, MD  Division of Gynecologic Oncology  Department of Obstetrics and Gynecology  [facility]

## 2020-10-31 ENCOUNTER — Inpatient Hospital Stay: Payer: 59

## 2020-10-31 ENCOUNTER — Other Ambulatory Visit: Payer: Self-pay

## 2020-10-31 ENCOUNTER — Inpatient Hospital Stay (HOSPITAL_BASED_OUTPATIENT_CLINIC_OR_DEPARTMENT_OTHER): Payer: 59 | Admitting: Gynecologic Oncology

## 2020-10-31 ENCOUNTER — Encounter: Payer: Self-pay | Admitting: Gynecologic Oncology

## 2020-10-31 VITALS — BP 113/78 | HR 87 | Temp 98.4°F | Resp 18 | Ht 65.0 in | Wt 138.0 lb

## 2020-10-31 DIAGNOSIS — N764 Abscess of vulva: Secondary | ICD-10-CM | POA: Diagnosis not present

## 2020-10-31 LAB — CBC WITH DIFFERENTIAL (CANCER CENTER ONLY)
Abs Immature Granulocytes: 0.01 10*3/uL (ref 0.00–0.07)
Basophils Absolute: 0 10*3/uL (ref 0.0–0.1)
Basophils Relative: 0 %
Eosinophils Absolute: 0.1 10*3/uL (ref 0.0–0.5)
Eosinophils Relative: 1 %
HCT: 31.7 % — ABNORMAL LOW (ref 36.0–46.0)
Hemoglobin: 10.9 g/dL — ABNORMAL LOW (ref 12.0–15.0)
Immature Granulocytes: 0 %
Lymphocytes Relative: 22 %
Lymphs Abs: 1.6 10*3/uL (ref 0.7–4.0)
MCH: 30.4 pg (ref 26.0–34.0)
MCHC: 34.4 g/dL (ref 30.0–36.0)
MCV: 88.5 fL (ref 80.0–100.0)
Monocytes Absolute: 0.5 10*3/uL (ref 0.1–1.0)
Monocytes Relative: 7 %
Neutro Abs: 5.3 10*3/uL (ref 1.7–7.7)
Neutrophils Relative %: 70 %
Platelet Count: 209 10*3/uL (ref 150–400)
RBC: 3.58 MIL/uL — ABNORMAL LOW (ref 3.87–5.11)
RDW: 11.9 % (ref 11.5–15.5)
WBC Count: 7.5 10*3/uL (ref 4.0–10.5)
nRBC: 0 % (ref 0.0–0.2)

## 2020-10-31 NOTE — Progress Notes (Signed)
Gynecologic Oncology Return Clinic Visit  10/31/20  Reason for Visit: Follow-up regarding postoperative infection  Interval History: Patient presents again today for examination of her incision and packing.  She notes overall feeling better than she did yesterday.  Her dizziness has resolved.  She denies any fevers, shaking chills, or feeling cold.  Her vulvar pain continues to be about the same, which she rates at a 2-3 out of 10.  She continues to have discharge that is similar to what she reported yesterday.  Past Medical/Surgical History: Past Medical History:  Diagnosis Date   Vulvar abscess 2022   Wears glasses 10/19/2020    Past Surgical History:  Procedure Laterality Date   CESAREAN SECTION  02/2016   INCISION AND DRAINAGE ABSCESS N/A 10/25/2020   Procedure: VULVA  ABSCESS TRACT EXCISION;  Surgeon: Carver Fila, MD;  Location: [facility];  Service: Gynecology;  Laterality: N/A;   INCISION AND DRAINAGE PERIRECTAL ABSCESS Right 07/26/2020   Procedure: IRRIGATION AND DEBRIDEMENT right labial ABSCESS;  Surgeon: Fritzi Mandes, MD;  Location: [facility] OR;  Service: General;  Laterality: Right;    Family History  Problem Relation Age of Onset   Healthy Mother    Diabetes Father    Cancer Maternal Grandmother    Breast cancer Maternal Grandmother    Cancer Maternal Grandfather    Lung cancer Maternal Grandfather    Healthy Paternal Grandmother    Heart disease Paternal Grandmother    Healthy Paternal Grandfather    Colon cancer Neg Hx    Ovarian cancer Neg Hx    Endometrial cancer Neg Hx    Pancreatic cancer Neg Hx    Prostate cancer Neg Hx     Social History   Socioeconomic History   Marital status: Married    Spouse name: Not on file   Number of children: Not on file   Years of education: Not on file   Highest education level: Not on file  Occupational History   Occupation: Airline pilot  Tobacco Use   Smoking status: Never   Smokeless tobacco:  Never  Vaping Use   Vaping Use: Never used  Substance and Sexual Activity   Alcohol use: Never   Drug use: Never   Sexual activity: Not Currently    Birth control/protection: Condom  Other Topics Concern   Not on file  Social History Narrative   Not on file   Social Determinants of Health   Financial Resource Strain: Not on file  Food Insecurity: Not on file  Transportation Needs: Not on file  Physical Activity: Not on file  Stress: Not on file  Social Connections: Not on file    Current Medications:  Current Outpatient Medications:    naproxen (NAPROSYN) 500 MG tablet, Take 1 tablet (500 mg total) by mouth daily as needed for moderate pain. (Patient taking differently: Take 500 mg by mouth daily.), Disp: 30 tablet, Rfl: 1   senna-docusate (SENOKOT-S) 8.6-50 MG tablet, Take 2 tablets by mouth at bedtime. For AFTER surgery, do not take if having diarrhea, Disp: 60 tablet, Rfl: 1   sulfamethoxazole-trimethoprim (BACTRIM DS) 800-160 MG tablet, Take 1 tablet by mouth 2 (two) times daily., Disp: , Rfl:    Multiple Vitamin (MULTIVITAMIN WITH MINERALS) TABS tablet, Take 1 tablet by mouth daily., Disp: , Rfl:    omega-3 acid ethyl esters (LOVAZA) 1 g capsule, Take 1 g by mouth daily., Disp: , Rfl:    ondansetron (ZOFRAN-ODT) 4 MG disintegrating tablet, Take 4 mg  by mouth every 8 (eight) hours as needed. (Patient not taking: No sig reported), Disp: , Rfl:    oxyCODONE (OXY IR/ROXICODONE) 5 MG immediate release tablet, Take 1 tablet (5 mg total) by mouth every 4 (four) hours as needed for severe pain. Do not take and drive (Patient not taking: No sig reported), Disp: 15 tablet, Rfl: 0  Review of Systems: Denies appetite changes, fevers, chills, fatigue, unexplained weight changes. Denies hearing loss, neck lumps or masses, mouth sores, ringing in ears or voice changes. Denies cough or wheezing.  Denies shortness of breath. Denies chest pain or palpitations. Denies leg swelling. Denies  abdominal distention, pain, blood in stools, constipation, diarrhea, nausea, vomiting, or early satiety. Denies pain with intercourse, dysuria, frequency, hematuria or incontinence. Denies joint pain, back pain or muscle pain/cramps. Denies itching, rash, or wounds. Denies dizziness, headaches, numbness or seizures. Denies swollen lymph nodes or glands, denies easy bruising or bleeding. Denies anxiety, depression, confusion, or decreased concentration.  Physical Exam: BP 113/78 (BP Location: Left Arm, Patient Position: Sitting)   Pulse 87   Temp 98.4 F (36.9 C)   Resp 18   Ht (!) 5.5" (0.14 m)   Wt 138 lb (62.6 kg)   SpO2 100%   BMI 3207.42 kg/m  General: Alert, oriented, no acute distress. GU: External female genitalia notable for some bruising and discoloration of the right labia and right outer buttocks.  The bruising is relatively unchanged, the erythema, especially the blanching erythema has receded and improved some.  Packing removed and no evidence of purulent drainage on the packing or within the wound itself.  Wound probed and there is some necrotic appearing tissue but otherwise no purulence noted today.  Wound washed and repacked with quarter inch iodoform packing.    Laboratory & Radiologic Studies: CBC    Component Value Date/Time   WBC 7.5 10/31/2020 1525   WBC 6.7 09/13/2020 1941   RBC 3.58 (L) 10/31/2020 1525   HGB 10.9 (L) 10/31/2020 1525   HCT 31.7 (L) 10/31/2020 1525   PLT 209 10/31/2020 1525   MCV 88.5 10/31/2020 1525   MCH 30.4 10/31/2020 1525   MCHC 34.4 10/31/2020 1525   RDW 11.9 10/31/2020 1525   LYMPHSABS 1.6 10/31/2020 1525   MONOABS 0.5 10/31/2020 1525   EOSABS 0.1 10/31/2020 1525   BASOSABS 0.0 10/31/2020 1525     Assessment & Plan: Evynn Boutelle is a 35 y.o. woman who is s/p excision of a ischio anal/ischio rectal abscess tract last week presenting for a wound check.  The patient has had significant improvement in her symptoms.  Her blood  pressure is improved today and her white count has normalized, down from almost 13-7.5.  Clinically, her wound is also significantly improved.  I do not feel any areas of fluctuance and there is no evidence of purulence on exam today.  Culture shows few organisms, awaiting speciation and sensitivities.  Discussed with patient continuing Bactrim for now.  I think given the depth of the incision, and some necrotic tissue I see, I recommend that in the next couple of days we plan to do an outpatient procedure with further opening of her incision and debridement of necrotic tissue.  Given the depth of the incision, I think if it is possible to put a wound VAC on, this would be very beneficial from a wound healing standpoint.  I worry about her ability or her husband's ability to pack the wound at home.  We will likely plan for  surgery on Thursday if this is feasible, and I will see her tomorrow for another wound check and packing.   22 minutes of total time was spent for this patient encounter, including preparation, face-to-face counseling with the patient and coordination of care, and documentation of the encounter.  Eugene Garnet, MD  Division of Gynecologic Oncology  Department of Obstetrics and Gynecology  [facility]

## 2020-10-31 NOTE — Patient Instructions (Signed)
I will see you again for packing change tomorrow and we will hopefully plan to do the procedure to open the incision and put the wound management system on this Thursday (its called a wound vac)

## 2020-11-01 ENCOUNTER — Other Ambulatory Visit: Payer: Self-pay | Admitting: Gynecologic Oncology

## 2020-11-01 ENCOUNTER — Telehealth: Payer: Self-pay | Admitting: Oncology

## 2020-11-01 ENCOUNTER — Encounter (HOSPITAL_BASED_OUTPATIENT_CLINIC_OR_DEPARTMENT_OTHER): Payer: Self-pay | Admitting: Gynecologic Oncology

## 2020-11-01 ENCOUNTER — Encounter: Payer: Self-pay | Admitting: Gynecologic Oncology

## 2020-11-01 ENCOUNTER — Telehealth: Payer: Self-pay

## 2020-11-01 ENCOUNTER — Inpatient Hospital Stay (HOSPITAL_BASED_OUTPATIENT_CLINIC_OR_DEPARTMENT_OTHER): Payer: 59 | Admitting: Gynecologic Oncology

## 2020-11-01 ENCOUNTER — Other Ambulatory Visit: Payer: Self-pay

## 2020-11-01 VITALS — BP 109/73 | HR 92 | Temp 98.4°F | Resp 18 | Ht 65.0 in | Wt 139.2 lb

## 2020-11-01 DIAGNOSIS — N764 Abscess of vulva: Secondary | ICD-10-CM

## 2020-11-01 DIAGNOSIS — T8149XA Infection following a procedure, other surgical site, initial encounter: Secondary | ICD-10-CM

## 2020-11-01 HISTORY — DX: Infection following a procedure, other surgical site, initial encounter: T81.49XA

## 2020-11-01 NOTE — Patient Instructions (Signed)
I will see you tomorrow for surgery. I hope that we will have approval from your insurance to put the wound vac on tomorrow. If we don't, I still would like to do the procedure tomorrow and we will hope that Friday we can place the wound vac.

## 2020-11-01 NOTE — Progress Notes (Signed)
Home health services have been requested since patient will have a wound vac placed in the OR on November 02, 2020 by Dr. Eugene Garnet

## 2020-11-01 NOTE — Telephone Encounter (Signed)
Called [place] [facility] [place] [phone]) to arrange for wound vac tomorrow at 12:00 at the [facility].  She said prior authorization will need to be done by them. She is going to fax a form and will need demographics sent back to her at [phone].  She will call back to let us know if it is approved.

## 2020-11-01 NOTE — Telephone Encounter (Signed)
Faxed referral order as requested to [facility] for Wound Vac dressing changes.

## 2020-11-01 NOTE — Progress Notes (Signed)
Gynecologic Oncology Return Clinic Visit  11/01/20  Reason for Visit: Follow-up regarding postoperative infection   Interval History: Patient reports having some difficulty sleeping last night.  Vulvar pain is about the same.  Denies any dizziness.  Has had a headache intermittently.  Denies any shaking chills or fevers.  Feels constipated.  Has been taking Senokot.  Past Medical/Surgical History: Past Medical History:  Diagnosis Date   Vulvar abscess 2022   Wears glasses 10/19/2020   Wound infection after surgery 11/01/2020    Past Surgical History:  Procedure Laterality Date   CESAREAN SECTION  02/2016   INCISION AND DRAINAGE ABSCESS N/A 10/25/2020   Procedure: VULVA  ABSCESS TRACT EXCISION;  Surgeon: Carver Fila, MD;  Location: [facility];  Service: Gynecology;  Laterality: N/A;   INCISION AND DRAINAGE PERIRECTAL ABSCESS Right 07/26/2020   Procedure: IRRIGATION AND DEBRIDEMENT right labial ABSCESS;  Surgeon: Fritzi Mandes, MD;  Location: [facility] OR;  Service: General;  Laterality: Right;    Family History  Problem Relation Age of Onset   Healthy Mother    Diabetes Father    Cancer Maternal Grandmother    Breast cancer Maternal Grandmother    Cancer Maternal Grandfather    Lung cancer Maternal Grandfather    Healthy Paternal Grandmother    Heart disease Paternal Grandmother    Healthy Paternal Grandfather    Colon cancer Neg Hx    Ovarian cancer Neg Hx    Endometrial cancer Neg Hx    Pancreatic cancer Neg Hx    Prostate cancer Neg Hx     Social History   Socioeconomic History   Marital status: Married    Spouse name: Not on file   Number of children: Not on file   Years of education: Not on file   Highest education level: Not on file  Occupational History   Occupation: Airline pilot  Tobacco Use   Smoking status: Never   Smokeless tobacco: Never  Vaping Use   Vaping Use: Never used  Substance and Sexual Activity   Alcohol use: Never    Drug use: Never   Sexual activity: Not Currently    Birth control/protection: Condom  Other Topics Concern   Not on file  Social History Narrative   Not on file   Social Determinants of Health   Financial Resource Strain: Not on file  Food Insecurity: Not on file  Transportation Needs: Not on file  Physical Activity: Not on file  Stress: Not on file  Social Connections: Not on file    Current Medications:  Current Outpatient Medications:    naproxen (NAPROSYN) 500 MG tablet, Take 1 tablet (500 mg total) by mouth daily as needed for moderate pain. (Patient taking differently: Take 500 mg by mouth daily.), Disp: 30 tablet, Rfl: 1   senna-docusate (SENOKOT-S) 8.6-50 MG tablet, Take 2 tablets by mouth at bedtime. For AFTER surgery, do not take if having diarrhea, Disp: 60 tablet, Rfl: 1   sulfamethoxazole-trimethoprim (BACTRIM DS) 800-160 MG tablet, Take 1 tablet by mouth 2 (two) times daily., Disp: , Rfl:    Multiple Vitamin (MULTIVITAMIN WITH MINERALS) TABS tablet, Take 1 tablet by mouth daily., Disp: , Rfl:    omega-3 acid ethyl esters (LOVAZA) 1 g capsule, Take 1 g by mouth daily., Disp: , Rfl:    ondansetron (ZOFRAN-ODT) 4 MG disintegrating tablet, Take 4 mg by mouth every 8 (eight) hours as needed. (Patient not taking: No sig reported), Disp: , Rfl:    oxyCODONE (  OXY IR/ROXICODONE) 5 MG immediate release tablet, Take 1 tablet (5 mg total) by mouth every 4 (four) hours as needed for severe pain. Do not take and drive (Patient not taking: No sig reported), Disp: 15 tablet, Rfl: 0  Review of Systems: Pertinent positives as above. Denies appetite changes, fevers, chills, unexplained weight changes. Denies hearing loss, neck lumps or masses, mouth sores, ringing in ears or voice changes. Denies cough or wheezing.  Denies shortness of breath. Denies chest pain or palpitations. Denies leg swelling. Denies abdominal distention, pain, blood in stools, constipation, diarrhea, nausea,  vomiting, or early satiety. Denies pain with intercourse, dysuria, frequency, hematuria or incontinence.  Denies joint pain, back pain or muscle pain/cramps. Denies itching, rash, or wounds. Denies dizziness, headaches, numbness or seizures. Denies swollen lymph nodes or glands, denies easy bruising or bleeding. Denies anxiety, depression, confusion, or decreased concentration.  Physical Exam: BP 109/73 (BP Location: Right Arm, Patient Position: Sitting)   Pulse 92   Temp 98.4 F (36.9 C) (Oral)   Resp 18   Ht 5' 5" (1.651 m)   Wt 139 lb 3.2 oz (63.1 kg)   SpO2 100%   BMI 23.16 kg/m  General: Alert, oriented, no acute distress. HEENT: Normocephalic, atraumatic, sclera anicteric.  GU: Erythema over the right buttocks has improved.  Bruising is stable.  Packing removed, no evidence of purulent drainage noted.  Wound probed and continues to be some necrotic appearing tissue, no purulence.  Wound washed, and repacked with half-inch iodoform packing.    Laboratory & Radiologic Studies: None new  Assessment & Plan: Sabrina Mullins is a 35 y.o. woman who is s/p excision of a ischio anal/ischio rectal abscess tract last week presenting for a wound check.   Patient's wound is stable today.  We have surgery tomorrow for further opening of the wound with debridement and drainage although I do not feel any additional areas of fluctuance.  My goal had been to put on a wound VAC tomorrow.  I am not sure that this was good to be possible from an insurance standpoint.  I still think that the wound needs to be opened and debrided even if this means waiting to be able to put the wound VAC on until Friday or early next week.  Discussed with patient that there is a small chance, given findings at the time of surgery, I may recommend admission for 1-2 days for pain management and IV antibiotics.  18 minutes of total time was spent for this patient encounter, including preparation, face-to-face counseling with  the patient and coordination of care, and documentation of the encounter.  Cheyrl Buley, MD  Division of Gynecologic Oncology  Department of Obstetrics and Gynecology  [facility]   

## 2020-11-01 NOTE — Telephone Encounter (Signed)
Called [facility] [phone]) to arrange for patient to have wound vac dressing changes three times a week.  They said to fax the order to [phone] for them to review and they will get back to [place].

## 2020-11-01 NOTE — Progress Notes (Addendum)
Spoke w/ via phone for pre-op interview---pt Lab needs dos----urine poct               Lab results------cbc with dif 10-31-2020 COVID test -----patient states asymptomatic no test needed Arrive at -------1000 am 11-02-2020 NPO after MN NO Solid Food.  Clear liquids from MN until---900 am then npo Med rec completed Medications to take morning of surgery -----naprosyn prn Diabetic medication -----n/a Patient instructed no nail polish to be worn day of surgery Patient instructed to bring photo id and insurance card day of surgery Patient aware to have Driver (ride ) / caregiver  spouse Sabrina Mullins   for 24 hours after surgery  Patient Special Instructions -----none Pre-Op special Istructions -----none Patient verbalized understanding of instructions that were given at this phone interview. Patient denies shortness of breath, chest pain, fever, cough at this phone interview.

## 2020-11-01 NOTE — H&P (View-Only) (Signed)
Gynecologic Oncology Return Clinic Visit  11/01/20  Reason for Visit: Follow-up regarding postoperative infection   Interval History: Patient reports having some difficulty sleeping last night.  Vulvar pain is about the same.  Denies any dizziness.  Has had a headache intermittently.  Denies any shaking chills or fevers.  Feels constipated.  Has been taking Senokot.  Past Medical/Surgical History: Past Medical History:  Diagnosis Date   Vulvar abscess 2022   Wears glasses 10/19/2020   Wound infection after surgery 11/01/2020    Past Surgical History:  Procedure Laterality Date   CESAREAN SECTION  02/2016   INCISION AND DRAINAGE ABSCESS N/A 10/25/2020   Procedure: VULVA  ABSCESS TRACT EXCISION;  Surgeon: Carver Fila, MD;  Location: [facility];  Service: Gynecology;  Laterality: N/A;   INCISION AND DRAINAGE PERIRECTAL ABSCESS Right 07/26/2020   Procedure: IRRIGATION AND DEBRIDEMENT right labial ABSCESS;  Surgeon: Fritzi Mandes, MD;  Location: [facility] OR;  Service: General;  Laterality: Right;    Family History  Problem Relation Age of Onset   Healthy Mother    Diabetes Father    Cancer Maternal Grandmother    Breast cancer Maternal Grandmother    Cancer Maternal Grandfather    Lung cancer Maternal Grandfather    Healthy Paternal Grandmother    Heart disease Paternal Grandmother    Healthy Paternal Grandfather    Colon cancer Neg Hx    Ovarian cancer Neg Hx    Endometrial cancer Neg Hx    Pancreatic cancer Neg Hx    Prostate cancer Neg Hx     Social History   Socioeconomic History   Marital status: Married    Spouse name: Not on file   Number of children: Not on file   Years of education: Not on file   Highest education level: Not on file  Occupational History   Occupation: Airline pilot  Tobacco Use   Smoking status: Never   Smokeless tobacco: Never  Vaping Use   Vaping Use: Never used  Substance and Sexual Activity   Alcohol use: Never    Drug use: Never   Sexual activity: Not Currently    Birth control/protection: Condom  Other Topics Concern   Not on file  Social History Narrative   Not on file   Social Determinants of Health   Financial Resource Strain: Not on file  Food Insecurity: Not on file  Transportation Needs: Not on file  Physical Activity: Not on file  Stress: Not on file  Social Connections: Not on file    Current Medications:  Current Outpatient Medications:    naproxen (NAPROSYN) 500 MG tablet, Take 1 tablet (500 mg total) by mouth daily as needed for moderate pain. (Patient taking differently: Take 500 mg by mouth daily.), Disp: 30 tablet, Rfl: 1   senna-docusate (SENOKOT-S) 8.6-50 MG tablet, Take 2 tablets by mouth at bedtime. For AFTER surgery, do not take if having diarrhea, Disp: 60 tablet, Rfl: 1   sulfamethoxazole-trimethoprim (BACTRIM DS) 800-160 MG tablet, Take 1 tablet by mouth 2 (two) times daily., Disp: , Rfl:    Multiple Vitamin (MULTIVITAMIN WITH MINERALS) TABS tablet, Take 1 tablet by mouth daily., Disp: , Rfl:    omega-3 acid ethyl esters (LOVAZA) 1 g capsule, Take 1 g by mouth daily., Disp: , Rfl:    ondansetron (ZOFRAN-ODT) 4 MG disintegrating tablet, Take 4 mg by mouth every 8 (eight) hours as needed. (Patient not taking: No sig reported), Disp: , Rfl:    oxyCODONE (  OXY IR/ROXICODONE) 5 MG immediate release tablet, Take 1 tablet (5 mg total) by mouth every 4 (four) hours as needed for severe pain. Do not take and drive (Patient not taking: No sig reported), Disp: 15 tablet, Rfl: 0  Review of Systems: Pertinent positives as above. Denies appetite changes, fevers, chills, unexplained weight changes. Denies hearing loss, neck lumps or masses, mouth sores, ringing in ears or voice changes. Denies cough or wheezing.  Denies shortness of breath. Denies chest pain or palpitations. Denies leg swelling. Denies abdominal distention, pain, blood in stools, constipation, diarrhea, nausea,  vomiting, or early satiety. Denies pain with intercourse, dysuria, frequency, hematuria or incontinence.  Denies joint pain, back pain or muscle pain/cramps. Denies itching, rash, or wounds. Denies dizziness, headaches, numbness or seizures. Denies swollen lymph nodes or glands, denies easy bruising or bleeding. Denies anxiety, depression, confusion, or decreased concentration.  Physical Exam: BP 109/73 (BP Location: Right Arm, Patient Position: Sitting)   Pulse 92   Temp 98.4 F (36.9 C) (Oral)   Resp 18   Ht 5\' 5"  (1.651 m)   Wt 139 lb 3.2 oz (63.1 kg)   SpO2 100%   BMI 23.16 kg/m  General: Alert, oriented, no acute distress. HEENT: Normocephalic, atraumatic, sclera anicteric.  GU: Erythema over the right buttocks has improved.  Bruising is stable.  Packing removed, no evidence of purulent drainage noted.  Wound probed and continues to be some necrotic appearing tissue, no purulence.  Wound washed, and repacked with half-inch iodoform packing.    Laboratory & Radiologic Studies: None new  Assessment & Plan: Sabrina Mullins is a 35 y.o. woman who is s/p excision of a ischio anal/ischio rectal abscess tract last week presenting for a wound check.   Patient's wound is stable today.  We have surgery tomorrow for further opening of the wound with debridement and drainage although I do not feel any additional areas of fluctuance.  My goal had been to put on a wound VAC tomorrow.  I am not sure that this was good to be possible from an insurance standpoint.  I still think that the wound needs to be opened and debrided even if this means waiting to be able to put the wound VAC on until Friday or early next week.  Discussed with patient that there is a small chance, given findings at the time of surgery, I may recommend admission for 1-2 days for pain management and IV antibiotics.  18 minutes of total time was spent for this patient encounter, including preparation, face-to-face counseling with  the patient and coordination of care, and documentation of the encounter.  Wednesday, MD  Division of Gynecologic Oncology  Department of Obstetrics and Gynecology  [facility]

## 2020-11-02 ENCOUNTER — Ambulatory Visit (HOSPITAL_BASED_OUTPATIENT_CLINIC_OR_DEPARTMENT_OTHER): Payer: 59 | Admitting: Anesthesiology

## 2020-11-02 ENCOUNTER — Ambulatory Visit (HOSPITAL_BASED_OUTPATIENT_CLINIC_OR_DEPARTMENT_OTHER)
Admission: RE | Admit: 2020-11-02 | Discharge: 2020-11-02 | Disposition: A | Discharge status: Home / Self Care | Payer: 59 | Attending: Gynecologic Oncology | Admitting: Gynecologic Oncology

## 2020-11-02 ENCOUNTER — Encounter (HOSPITAL_BASED_OUTPATIENT_CLINIC_OR_DEPARTMENT_OTHER)
Admission: RE | Disposition: A | Discharge status: Home / Self Care | Payer: Self-pay | Source: Home / Self Care | Attending: Gynecologic Oncology

## 2020-11-02 ENCOUNTER — Telehealth: Payer: Self-pay | Admitting: Oncology

## 2020-11-02 ENCOUNTER — Encounter (HOSPITAL_BASED_OUTPATIENT_CLINIC_OR_DEPARTMENT_OTHER): Payer: Self-pay | Admitting: Gynecologic Oncology

## 2020-11-02 DIAGNOSIS — N764 Abscess of vulva: Secondary | ICD-10-CM | POA: Diagnosis not present

## 2020-11-02 DIAGNOSIS — K219 Gastro-esophageal reflux disease without esophagitis: Secondary | ICD-10-CM | POA: Diagnosis not present

## 2020-11-02 DIAGNOSIS — Z9889 Other specified postprocedural states: Secondary | ICD-10-CM | POA: Diagnosis not present

## 2020-11-02 DIAGNOSIS — T8149XA Infection following a procedure, other surgical site, initial encounter: Secondary | ICD-10-CM | POA: Diagnosis not present

## 2020-11-02 DIAGNOSIS — K5900 Constipation, unspecified: Secondary | ICD-10-CM | POA: Insufficient documentation

## 2020-11-02 DIAGNOSIS — Z79899 Other long term (current) drug therapy: Secondary | ICD-10-CM | POA: Insufficient documentation

## 2020-11-02 DIAGNOSIS — Z4889 Encounter for other specified surgical aftercare: Secondary | ICD-10-CM | POA: Insufficient documentation

## 2020-11-02 DIAGNOSIS — T8140XA Infection following a procedure, unspecified, initial encounter: Secondary | ICD-10-CM | POA: Diagnosis present

## 2020-11-02 HISTORY — PX: VULVAR LESION REMOVAL: SHX5391

## 2020-11-02 LAB — POCT PREGNANCY, URINE: Preg Test, Ur: NEGATIVE

## 2020-11-02 LAB — AEROBIC/ANAEROBIC CULTURE W GRAM STAIN (SURGICAL/DEEP WOUND): Gram Stain: NONE SEEN

## 2020-11-02 SURGERY — EXAM UNDER ANESTHESIA
Anesthesia: General | Site: Vagina

## 2020-11-02 MED ORDER — MIDAZOLAM HCL 2 MG/2ML IJ SOLN
INTRAMUSCULAR | Status: AC
  Filled 2020-11-02: qty 2

## 2020-11-02 MED ORDER — KETOROLAC TROMETHAMINE 30 MG/ML IJ SOLN
INTRAMUSCULAR | Status: DC | PRN
  Administered 2020-11-02: 30 mg via INTRAVENOUS

## 2020-11-02 MED ORDER — MIDAZOLAM HCL 2 MG/2ML IJ SOLN
INTRAMUSCULAR | Status: DC | PRN
  Administered 2020-11-02: 2 mg via INTRAVENOUS

## 2020-11-02 MED ORDER — FENTANYL CITRATE (PF) 100 MCG/2ML IJ SOLN
INTRAMUSCULAR | Status: DC | PRN
  Administered 2020-11-02: 25 ug via INTRAVENOUS
  Administered 2020-11-02: 50 ug via INTRAVENOUS

## 2020-11-02 MED ORDER — PROPOFOL 10 MG/ML IV BOLUS
INTRAVENOUS | Status: AC
  Filled 2020-11-02: qty 20

## 2020-11-02 MED ORDER — PROPOFOL 10 MG/ML IV BOLUS
INTRAVENOUS | Status: DC | PRN
  Administered 2020-11-02: 130 mg via INTRAVENOUS

## 2020-11-02 MED ORDER — DEXAMETHASONE SODIUM PHOSPHATE 10 MG/ML IJ SOLN
INTRAMUSCULAR | Status: DC | PRN
  Administered 2020-11-02: 4 mg via INTRAVENOUS

## 2020-11-02 MED ORDER — LIDOCAINE HCL (PF) 2 % IJ SOLN
INTRAMUSCULAR | Status: AC
  Filled 2020-11-02: qty 5

## 2020-11-02 MED ORDER — FENTANYL CITRATE (PF) 100 MCG/2ML IJ SOLN
INTRAMUSCULAR | Status: AC
  Filled 2020-11-02: qty 2

## 2020-11-02 MED ORDER — BUPIVACAINE HCL 0.25 % IJ SOLN
INTRAMUSCULAR | Status: DC | PRN
  Administered 2020-11-02: 10 mL

## 2020-11-02 MED ORDER — ACETAMINOPHEN 500 MG PO TABS
1000.0000 mg | ORAL_TABLET | ORAL | Status: AC
  Administered 2020-11-02: 1000 mg via ORAL

## 2020-11-02 MED ORDER — ONDANSETRON HCL 4 MG/2ML IJ SOLN
INTRAMUSCULAR | Status: DC | PRN
  Administered 2020-11-02: 4 mg via INTRAVENOUS

## 2020-11-02 MED ORDER — CIPROFLOXACIN IN D5W 400 MG/200ML IV SOLN
INTRAVENOUS | Status: AC
  Filled 2020-11-02: qty 200

## 2020-11-02 MED ORDER — LIDOCAINE 2% (20 MG/ML) 5 ML SYRINGE
INTRAMUSCULAR | Status: DC | PRN
  Administered 2020-11-02: 60 mg via INTRAVENOUS

## 2020-11-02 MED ORDER — PROMETHAZINE HCL 25 MG/ML IJ SOLN: 6.2500 mg | INTRAMUSCULAR | Status: DC | PRN

## 2020-11-02 MED ORDER — AMISULPRIDE (ANTIEMETIC) 5 MG/2ML IV SOLN: 10.0000 mg | Freq: Once | INTRAVENOUS | Status: DC | PRN

## 2020-11-02 MED ORDER — DEXAMETHASONE SODIUM PHOSPHATE 10 MG/ML IJ SOLN
4.0000 mg | INTRAMUSCULAR | Status: DC
Start: 2020-11-02 — End: 2020-11-02

## 2020-11-02 MED ORDER — ACETAMINOPHEN 500 MG PO TABS
ORAL_TABLET | ORAL | Status: AC
  Filled 2020-11-02: qty 2

## 2020-11-02 MED ORDER — 0.9 % SODIUM CHLORIDE (POUR BTL) OPTIME
TOPICAL | Status: DC | PRN
  Administered 2020-11-02: 1000 mL

## 2020-11-02 MED ORDER — KETOROLAC TROMETHAMINE 15 MG/ML IJ SOLN: 15.0000 mg | INTRAMUSCULAR | Status: DC

## 2020-11-02 MED ORDER — LACTATED RINGERS IV SOLN: INTRAVENOUS | Status: DC

## 2020-11-02 MED ORDER — CELECOXIB 200 MG PO CAPS
200.0000 mg | ORAL_CAPSULE | Freq: Once | ORAL | Status: AC
  Administered 2020-11-02: 200 mg via ORAL

## 2020-11-02 MED ORDER — FENTANYL CITRATE (PF) 100 MCG/2ML IJ SOLN: 25.0000 ug | INTRAMUSCULAR | Status: DC | PRN

## 2020-11-02 MED ORDER — GABAPENTIN 300 MG PO CAPS
300.0000 mg | ORAL_CAPSULE | ORAL | Status: AC
  Administered 2020-11-02: 300 mg via ORAL

## 2020-11-02 MED ORDER — CELECOXIB 200 MG PO CAPS
ORAL_CAPSULE | ORAL | Status: AC
  Filled 2020-11-02: qty 1

## 2020-11-02 MED ORDER — SCOPOLAMINE 1 MG/3DAYS TD PT72
MEDICATED_PATCH | TRANSDERMAL | Status: AC
  Filled 2020-11-02: qty 1

## 2020-11-02 MED ORDER — GABAPENTIN 300 MG PO CAPS
ORAL_CAPSULE | ORAL | Status: AC
  Filled 2020-11-02: qty 1

## 2020-11-02 MED ORDER — CIPROFLOXACIN IN D5W 400 MG/200ML IV SOLN
400.0000 mg | INTRAVENOUS | Status: AC
Start: 2020-11-02 — End: 2020-11-02
  Administered 2020-11-02: 400 mg via INTRAVENOUS

## 2020-11-02 MED ORDER — SCOPOLAMINE 1 MG/3DAYS TD PT72
1.0000 | MEDICATED_PATCH | TRANSDERMAL | Status: DC
Start: 2020-11-02 — End: 2020-11-02
  Administered 2020-11-02: 1.5 mg via TRANSDERMAL

## 2020-11-02 SURGICAL SUPPLY — 30 items
BLADE CLIPPER SENSICLIP SURGIC (BLADE) IMPLANT
BLADE SURG 11 STRL SS (BLADE) IMPLANT
BLADE SURG 15 STRL LF DISP TIS (BLADE) ×1 IMPLANT
BLADE SURG 15 STRL SS (BLADE) ×2
BNDG GAUZE ELAST 4 BULKY (GAUZE/BANDAGES/DRESSINGS) ×2 IMPLANT
CATH ROBINSON RED A/P 16FR (CATHETERS) ×2 IMPLANT
GAUZE 4X4 16PLY RFD (DISPOSABLE) IMPLANT
GAUZE 4X4 16PLY ~~LOC~~+RFID DBL (SPONGE) ×4 IMPLANT
GLOVE SURG ENC MOIS LTX SZ6 (GLOVE) ×4 IMPLANT
GOWN STRL REUS W/TWL LRG LVL3 (GOWN DISPOSABLE) ×2 IMPLANT
KIT TURNOVER CYSTO (KITS) ×2 IMPLANT
NEEDLE HYPO 25X1 1.5 SAFETY (NEEDLE) ×2 IMPLANT
NS IRRIG 500ML POUR BTL (IV SOLUTION) ×6 IMPLANT
PACK PERINEAL COLD (PAD) ×2 IMPLANT
PACK VAGINAL WOMENS (CUSTOM PROCEDURE TRAY) ×2 IMPLANT
PUNCH BIOPSY DERMAL 3 (INSTRUMENTS) IMPLANT
PUNCH BIOPSY DERMAL 3MM (INSTRUMENTS)
PUNCH BIOPSY DERMAL 4MM (INSTRUMENTS) IMPLANT
SPONGE T-LAP 18X18 ~~LOC~~+RFID (SPONGE) ×2 IMPLANT
SUT VIC AB 0 SH 27 (SUTURE) ×2 IMPLANT
SUT VIC AB 2-0 CT2 27 (SUTURE) IMPLANT
SUT VIC AB 2-0 SH 27 (SUTURE)
SUT VIC AB 2-0 SH 27XBRD (SUTURE) IMPLANT
SUT VIC AB 3-0 SH 27 (SUTURE) ×2
SUT VIC AB 3-0 SH 27X BRD (SUTURE) ×1 IMPLANT
SUT VIC AB 4-0 PS2 18 (SUTURE) ×2 IMPLANT
SWAB OB GYN 8IN STERILE 2PK (MISCELLANEOUS) IMPLANT
SYR BULB IRRIG 60ML STRL (SYRINGE) ×2 IMPLANT
TOWEL OR 17X26 10 PK STRL BLUE (TOWEL DISPOSABLE) ×4 IMPLANT
WATER STERILE IRR 500ML POUR (IV SOLUTION) ×2 IMPLANT

## 2020-11-02 NOTE — Interval H&P Note (Signed)
History and Physical Interval Note:  11/02/2020 10:26 AM  Sabrina Mullins  has presented today for surgery, with the diagnosis of post op wound infection.  The various methods of treatment have been discussed with the patient and family. After consideration of risks, benefits and other options for treatment, the patient has consented to  Procedure(s): EXAM UNDER ANESTHESIA (N/A) Irrigation and Drainage of VULVAR LESION with wound vac (N/A) as a surgical intervention.  The patient's history has been reviewed, patient examined, no change in status, stable for surgery.  I have reviewed the patient's chart and labs.  Questions were answered to the patient's satisfaction.     Carver Fila

## 2020-11-02 NOTE — Progress Notes (Signed)
Intraoperative consult  I was asked to take a look at the abscess cavity by Dr. Pricilla Holm.  She is well-known to me as well.  The wound is open and appears to be adequately drained with clearly viable tissue circumferentially that is well perfused emanating to the right ischiorectal fossa posteriorly and up to the posterior labia anteriorly.  I was able to do a digital rectal exam with her under anesthesia and there were no palpable or visible defects to the anorectal region including any evident fistula.  She has had prior studies including MRIs as well as EUAs that have not been able to demonstrate a fistula to the rectum.  Although an occult fistula is possible, there is no evidence of one at this time.  We discussed plans moving forward with basic wound care.  Our office will work to arrange follow-up as well to assist in any wound care needs that arise.  Marin Olp, MD FACS [facility]

## 2020-11-02 NOTE — Anesthesia Procedure Notes (Addendum)
Procedure Name: LMA Insertion Date/Time: 11/02/2020 11:07 AM Performed by: Francie Massing, CRNA Pre-anesthesia Checklist: Patient identified, Emergency Drugs available, Suction available and Patient being monitored Patient Re-evaluated:Patient Re-evaluated prior to induction Oxygen Delivery Method: Circle system utilized Preoxygenation: Pre-oxygenation with 100% oxygen Induction Type: IV induction Ventilation: Mask ventilation without difficulty LMA: LMA inserted LMA Size: 4.0 Number of attempts: 1 Airway Equipment and Method: Bite block Placement Confirmation: positive ETCO2 Tube secured with: Tape Dental Injury: Teeth and Oropharynx as per pre-operative assessment

## 2020-11-02 NOTE — Transfer of Care (Signed)
Immediate Anesthesia Transfer of Care Note  Patient: Faten Frieson  Procedure(s) Performed: Procedure(s) (LRB): EXAM UNDER ANESTHESIA (N/A) Irrigation and Drainage of VULVAR LESION (N/A)  Patient Location: PACU  Anesthesia Type: General  Level of Consciousness: awake, oriented, sedated and patient cooperative  Airway & Oxygen Therapy: Patient Spontanous Breathing and Patient connected to face mask oxygen  Post-op Assessment: Report given to PACU RN and Post -op Vital signs reviewed and stable  Post vital signs: Reviewed and stable  Complications: No apparent anesthesia complications  Last Vitals:  Vitals Value Taken Time  BP 101/61 11/02/20 1145  Temp    Pulse 64 11/02/20 1148  Resp 13 11/02/20 1148  SpO2 100 % 11/02/20 1148  Vitals shown include unvalidated device data.  Last Pain:  Vitals:   11/02/20 1034  TempSrc: Oral  PainSc: 3       Patients Stated Pain Goal: 3 (11/02/20 1034)  Complications: No notable events documented.

## 2020-11-02 NOTE — Discharge Instructions (Addendum)
11/02/2020  Return to work: 2 weeks if applicable  Today Dr. Pricilla Holm opened the wound and cleaned it out. Dr. Marin Olp with [facility] assessed the wound as well. He did not feel that a wound vac was necessary. The plan will be for a dressing change in the office either with Dr. Winferd Humphrey office or with Dr. Jeronimo Norma office. You will be instructed on changing the dressing at these visits.  You can resume your home medications. Recommend only using the zofran for nausea if needed since it can cause constipation.  Activity: 1. Be up and out of the bed during the day.  Take a nap if needed.  You may walk up steps but be careful and use the hand rail.  Stair climbing will tire you more than you think, you may need to stop part way and rest.   2. No lifting or straining for 6 weeks.  3. No driving for minimum of 24 hours after the procedure if your reaction time has returned.  Do not drive if you are taking narcotic pain medicine.  4. Shower daily.  Use your regular soap and water and pat your incision dry; don't rub.  No tub baths until cleared by your surgeon.   5. No sexual activity and nothing in the vagina for 4 weeks.  6. You may experience a small amount of clear drainage/mild bleeding from your incision, which is normal.  If the drainage persists or increases, please call the office.  7. Take Tylenol or naproxen first for pain and only use narcotic pain medication for severe pain not relieved by the Tylenol or Ibuprofen.  Monitor your Tylenol intake to a max of 4,000 mg.  Diet: 1. Low sodium Heart Healthy Diet is recommended.  2. It is safe to use a laxative, such as Miralax or Colace, if you have difficulty moving your bowels. You can take Sennakot at bedtime every evening to keep bowel movements regular and to prevent constipation.    Wound Care: 1. Keep clean and dry.  Shower daily.  Reasons to call the Doctor: Fever - Oral temperature greater than  100.4 degrees Fahrenheit Foul-smelling vaginal discharge Difficulty urinating Nausea and vomiting Increased pain at the site of the incision that is unrelieved with pain medicine. Difficulty breathing with or without chest pain New calf pain especially if only on one side Sudden, continuing increased vaginal bleeding with or without clots.   Contacts: For questions or concerns you should contact:  Dr. Eugene Garnet at [phone]  Warner Mccreedy, NP at [phone]  After Hours: call [phone] and have the GYN Oncologist paged/contacted     Post Anesthesia Home Care Instructions  Activity: Get plenty of rest for the remainder of the day. A responsible individual must stay with you for 24 hours following the procedure.  For the next 24 hours, DO NOT: -Drive a car -Advertising copywriter -Drink alcoholic beverages -Take any medication unless instructed by your physician -Make any legal decisions or sign important papers.  Meals: Start with liquid foods such as gelatin or soup. Progress to regular foods as tolerated. Avoid greasy, spicy, heavy foods. If nausea and/or vomiting occur, drink only clear liquids until the nausea and/or vomiting subsides. Call your physician if vomiting continues.  Special Instructions/Symptoms: Your throat may feel dry or sore from the anesthesia or the breathing tube placed in your throat during surgery. If this causes discomfort, gargle with warm salt water. The discomfort should disappear within 24 hours.  If  you had a scopolamine patch placed behind your ear for the management of post- operative nausea and/or vomiting:  1. The medication in the patch is effective for 72 hours, after which it should be removed.  Wrap patch in a tissue and discard in the trash. Wash hands thoroughly with soap and water. 2. You may remove the patch earlier than 72 hours if you experience unpleasant side effects which may include dry mouth, dizziness or visual  disturbances. 3. Avoid touching the patch. Wash your hands with soap and water after contact with the patch. Remove patch on Sunday 11/05/2020.

## 2020-11-02 NOTE — Anesthesia Preprocedure Evaluation (Addendum)
Anesthesia Evaluation  Patient identified by MRN, date of birth, ID band Patient awake    Reviewed: Allergy & Precautions, NPO status , Patient's Chart, lab work & pertinent test results  History of Anesthesia Complications Negative for: history of anesthetic complications  Airway Mallampati: II  TM Distance: >3 FB Neck ROM: Full    Dental no notable dental hx. (+) Dental Advisory Given   Pulmonary neg pulmonary ROS,    Pulmonary exam normal        Cardiovascular negative cardio ROS Normal cardiovascular exam     Neuro/Psych negative neurological ROS  negative psych ROS   GI/Hepatic Neg liver ROS, GERD  ,  Endo/Other  negative endocrine ROS  Renal/GU negative Renal ROS  Female GU complaint     Musculoskeletal negative musculoskeletal ROS (+)   Abdominal   Peds  Hematology negative hematology ROS (+)   Anesthesia Other Findings   Reproductive/Obstetrics                            Anesthesia Physical  Anesthesia Plan  ASA: 2  Anesthesia Plan: General   Post-op Pain Management:    Induction: Intravenous  PONV Risk Score and Plan: 3 and Ondansetron, Dexamethasone, Midazolam and Treatment may vary due to age or medical condition  Airway Management Planned: Mask and LMA  Additional Equipment: None  Intra-op Plan:   Post-operative Plan: Extubation in OR  Informed Consent: I have reviewed the patients History and Physical, chart, labs and discussed the procedure including the risks, benefits and alternatives for the proposed anesthesia with the patient or authorized representative who has indicated his/her understanding and acceptance.     Dental advisory given  Plan Discussed with: Anesthesiologist and CRNA  Anesthesia Plan Comments:        Anesthesia Quick Evaluation

## 2020-11-02 NOTE — Telephone Encounter (Signed)
Dawn from [employer] called and said the wound vac has been authorized/approved.  She will send a courier to [facility] with the vac today.  It should be there around surgery time of 12:00.Marland Kitchen

## 2020-11-02 NOTE — Anesthesia Postprocedure Evaluation (Signed)
Anesthesia Post Note  Patient: Sabrina Mullins  Procedure(s) Performed: EXAM UNDER ANESTHESIA (Vagina ) Irrigation and Drainage of VULVAR LESION (Vagina )     Patient location during evaluation: PACU Anesthesia Type: General Level of consciousness: sedated Pain management: pain level controlled Vital Signs Assessment: post-procedure vital signs reviewed and stable Respiratory status: spontaneous breathing and respiratory function stable Cardiovascular status: stable Postop Assessment: no apparent nausea or vomiting Anesthetic complications: no   No notable events documented.  Last Vitals:  Vitals:   11/02/20 1215 11/02/20 1230  BP: 98/63 98/63  Pulse: 64 62  Resp: 16 11  Temp:    SpO2: 100% 100%    Last Pain:  Vitals:   11/02/20 1200  TempSrc:   PainSc: 0-No pain                 Fujiko Picazo DANIEL

## 2020-11-02 NOTE — Op Note (Addendum)
PATIENT: Sabrina Mullins DATE: 11/02/20  Preop Diagnosis: Infection of recent surgical bed from Ischiorectal/ischioanal abscess tract excision  Postoperative Diagnosis: same as above  Surgery: exam under anesthesia.   Surgeons:  Carin Hock   Anesthesia: General   Estimated blood loss: minimal  IVF:  see I&O flowsheet   Urine output: n/a   Complications: None apparent  Pathology: none  Operative findings: After inferior aspect of the incision opened further, wound probed and no additional purulence of fluctuance noted. Wound bed healthy appearing without necrotic tissue. Dr. Cliffton Asters consulted intra-op - agreed no evidence of fistula with the anus.   Procedure: The patient was identified in the preoperative holding area. Informed consent was signed on the chart. Patient was seen history was reviewed and exam was performed.   The patient was then taken to the operating room and placed in the supine position with SCD hose on. General anesthesia was then induced without difficulty. She was then placed in the dorsolithotomy position. The perineum was prepped with Betadine. The vagina was prepped with Betadine. The patient was then draped after the prep was dried.   Timeout was performed the patient, procedure, antibiotic, allergy, and length of procedure.   The aspect of the prior incision that had been opened in clinic was further opened. Manual and visual exploration was performed with findings as above. The wound was copiously irrigated and healthy, bleeding tissue noted within the wound bed.  The wound was then packed with wet Kerlex.   All instrument, suture, laparotomy, Ray-Tec, and needle counts were correct x2. The patient tolerated the procedure well and was taken recovery room in stable condition.   Eugene Garnet MD Gynecologic Oncology

## 2020-11-03 ENCOUNTER — Other Ambulatory Visit: Payer: Self-pay | Admitting: Gynecologic Oncology

## 2020-11-03 ENCOUNTER — Other Ambulatory Visit: Payer: Self-pay

## 2020-11-03 ENCOUNTER — Inpatient Hospital Stay: Payer: 59 | Attending: Gynecologic Oncology

## 2020-11-03 ENCOUNTER — Encounter (HOSPITAL_BASED_OUTPATIENT_CLINIC_OR_DEPARTMENT_OTHER): Payer: Self-pay | Admitting: Gynecologic Oncology

## 2020-11-03 VITALS — BP 96/63 | HR 90 | Temp 98.5°F | Resp 16

## 2020-11-03 DIAGNOSIS — Z9889 Other specified postprocedural states: Secondary | ICD-10-CM | POA: Insufficient documentation

## 2020-11-03 DIAGNOSIS — B373 Candidiasis of vulva and vagina: Secondary | ICD-10-CM | POA: Insufficient documentation

## 2020-11-03 DIAGNOSIS — Z4801 Encounter for change or removal of surgical wound dressing: Secondary | ICD-10-CM

## 2020-11-03 DIAGNOSIS — N764 Abscess of vulva: Secondary | ICD-10-CM

## 2020-11-03 MED ORDER — SULFAMETHOXAZOLE-TRIMETHOPRIM 800-160 MG PO TABS
1.0000 | ORAL_TABLET | Freq: Two times a day (BID) | ORAL | 0 refills | Status: AC
Start: 2020-11-03 — End: 2020-11-10

## 2020-11-03 NOTE — Patient Instructions (Signed)
You have an Appointment with Dr. Lucilla Lame office on Tuesday July 5th at 10 am.  His office is located at:[address], [place], [place] [address]

## 2020-11-03 NOTE — Progress Notes (Signed)
Sabrina Mullins was seen today for a post surgical dressing change. Her husband accompanied her to appointment. She is alert and oriented, denies pain. She reports taking Naprosyn this morning. Denies Nausea or Vomiting. She is eating,drinking and urinating well. She reports small hard BM this morning. Encouraged use of miralax to avoid constipation and straining. Denies fever or chills.  Packing was removed without difficulty. Open right buttock wound, red and moist.Gloves were removed, hand hygiene performed and new clean gloves were dawned. Moistened kerlex roll was packed into the wound. Patient tolerated this well. Patient and husband instructed on dressing change, verbalized understanding. Patient has supplies to perform dressing changes at home.  Follow up plan for patient to see Dr. Lucilla Lame office for dressing change Tuesday July 5th at 10am.    Instructed to call office with any fever, chills, purulent drainage, uncontrolled pain or any other questions or concerns. Patient verbalizes understanding.   Pt aware of post op appointments as well as the office number [phone] and after hours number [phone] to call if she has any questions or concerns

## 2020-11-09 ENCOUNTER — Telehealth: Payer: Self-pay

## 2020-11-09 NOTE — Telephone Encounter (Signed)
Verified with [name] that she p/u the bactrim on 11-03-20 and taking as prescribed. Told her that she just needs to complete that course of ATB.  Pt did not request refill for bactrim.

## 2020-11-13 ENCOUNTER — Telehealth: Payer: Self-pay | Admitting: Oncology

## 2020-11-13 ENCOUNTER — Encounter: Payer: Self-pay | Admitting: Gynecologic Oncology

## 2020-11-13 NOTE — Telephone Encounter (Signed)
Called [facility] and rescheduled post op apt to tomorrow at 3:00 with Dr. Pricilla Holm.  Also discussed that she would like to go back full-time but would like to discuss it with Dr. Pricilla Holm tomorrow.

## 2020-11-14 ENCOUNTER — Encounter: Payer: Self-pay | Admitting: Gynecologic Oncology

## 2020-11-14 ENCOUNTER — Other Ambulatory Visit: Payer: Self-pay

## 2020-11-14 ENCOUNTER — Inpatient Hospital Stay (HOSPITAL_BASED_OUTPATIENT_CLINIC_OR_DEPARTMENT_OTHER): Payer: 59 | Admitting: Gynecologic Oncology

## 2020-11-14 VITALS — BP 115/70 | HR 83 | Temp 97.6°F | Resp 16 | Ht 65.0 in | Wt 135.8 lb

## 2020-11-14 DIAGNOSIS — N764 Abscess of vulva: Secondary | ICD-10-CM

## 2020-11-14 DIAGNOSIS — Z9889 Other specified postprocedural states: Secondary | ICD-10-CM

## 2020-11-14 DIAGNOSIS — B379 Candidiasis, unspecified: Secondary | ICD-10-CM

## 2020-11-14 MED ORDER — FLUCONAZOLE 150 MG PO TABS
150.0000 mg | ORAL_TABLET | Freq: Every day | ORAL | 0 refills | Status: AC
Start: 2020-11-14 — End: 2020-11-15

## 2020-11-14 MED ORDER — NORETHINDRONE ACET-ETHINYL EST 1-20 MG-MCG PO TABS: 1.0000 | ORAL_TABLET | Freq: Every day | ORAL | 11 refills | Status: DC

## 2020-11-14 NOTE — Patient Instructions (Signed)
Your wound is healing very well!  There are no signs of infection.  It has gotten much smaller over the last few weeks.  I do think that you have developed a yeast infection which is contributing to the burning when you urinate.  I sent a one-time dose of a yeast medication to your pharmacy.  Please let me know if the symptoms do not improve.  Keep packing the wound until it is too superficial to pack.  I have also sent in prescription for a combination birth control.  Take only the pills that have hormones to skip your period (do not take the sugar pills).

## 2020-11-14 NOTE — Progress Notes (Signed)
Gynecologic Oncology Return Clinic Visit  11/14/2020  Reason for Visit: Follow-up after exam under anesthesia and opening of the wound, postoperative check  Interval History: The patient presents today for follow-up and wound check.  She finished her course of antibiotics on Sunday.  She notes some minimal yellow drainage as well as darker appearing drainage, denies any odor or significant pain related to her incision.  Her husband continues to help her with daily dressing changes and packing.  She denies any fevers or chills.  She reports normal bowel function.  She describes her urine is still being a little slow in terms of starting her stream, otherwise denies urinary symptoms.  Has some burning on her perineum when she urinates.  The patient went back to work from home this week and she is doing accommodation of standing and lying down when she works.  She would like to go back to work next week.  Past Medical/Surgical History: Past Medical History:  Diagnosis Date   Vulvar abscess 2022   Wears glasses 10/19/2020   Wound infection after surgery 11/01/2020    Past Surgical History:  Procedure Laterality Date   CESAREAN SECTION  02/2016   INCISION AND DRAINAGE ABSCESS N/A 10/25/2020   Procedure: VULVA  ABSCESS TRACT EXCISION;  Surgeon: Carver Fila, MD;  Location: [facility];  Service: Gynecology;  Laterality: N/A;   INCISION AND DRAINAGE PERIRECTAL ABSCESS Right 07/26/2020   Procedure: IRRIGATION AND DEBRIDEMENT right labial ABSCESS;  Surgeon: Fritzi Mandes, MD;  Location: [facility] OR;  Service: General;  Laterality: Right;   VULVAR LESION REMOVAL N/A 11/02/2020   Procedure: Irrigation and Drainage of VULVAR LESION;  Surgeon: Carver Fila, MD;  Location: [facility];  Service: Gynecology;  Laterality: N/A;    Family History  Problem Relation Age of Onset   Healthy Mother    Diabetes Father    Cancer Maternal Grandmother    Breast cancer  Maternal Grandmother    Cancer Maternal Grandfather    Lung cancer Maternal Grandfather    Healthy Paternal Grandmother    Heart disease Paternal Grandmother    Healthy Paternal Grandfather    Colon cancer Neg Hx    Ovarian cancer Neg Hx    Endometrial cancer Neg Hx    Pancreatic cancer Neg Hx    Prostate cancer Neg Hx     Social History   Socioeconomic History   Marital status: Married    Spouse name: Not on file   Number of children: Not on file   Years of education: Not on file   Highest education level: Not on file  Occupational History   Occupation: Airline pilot  Tobacco Use   Smoking status: Never   Smokeless tobacco: Never  Vaping Use   Vaping Use: Never used  Substance and Sexual Activity   Alcohol use: Never   Drug use: Never   Sexual activity: Not Currently    Birth control/protection: Condom  Other Topics Concern   Not on file  Social History Narrative   Not on file   Social Determinants of Health   Financial Resource Strain: Not on file  Food Insecurity: Not on file  Transportation Needs: Not on file  Physical Activity: Not on file  Stress: Not on file  Social Connections: Not on file    Current Medications:  Current Outpatient Medications:    fluconazole (DIFLUCAN) 150 MG tablet, Take 1 tablet (150 mg total) by mouth daily for 1 day., Disp: 1  tablet, Rfl: 0   norethindrone-ethinyl estradiol (LOESTRIN 1/20, 21,) 1-20 MG-MCG tablet, Take 1 tablet by mouth daily. During months that you would like to skip your period, don't take the sugar pills, Disp: 28 tablet, Rfl: 11   Multiple Vitamin (MULTIVITAMIN WITH MINERALS) TABS tablet, Take 1 tablet by mouth daily., Disp: , Rfl:    naproxen (NAPROSYN) 500 MG tablet, Take 1 tablet (500 mg total) by mouth daily as needed for moderate pain., Disp: 30 tablet, Rfl: 1   omega-3 acid ethyl esters (LOVAZA) 1 g capsule, Take 1 g by mouth daily. (Patient not taking: Reported on 11/03/2020), Disp: , Rfl:    ondansetron  (ZOFRAN-ODT) 4 MG disintegrating tablet, Take 4 mg by mouth every 8 (eight) hours as needed. (Patient not taking: Reported on 11/03/2020), Disp: , Rfl:    oxyCODONE (OXY IR/ROXICODONE) 5 MG immediate release tablet, Take 1 tablet (5 mg total) by mouth every 4 (four) hours as needed for severe pain. Do not take and drive (Patient not taking: Reported on 11/03/2020), Disp: 15 tablet, Rfl: 0   polyethylene glycol (MIRALAX / GLYCOLAX) 17 g packet, Take 17 g by mouth daily., Disp: , Rfl:    senna-docusate (SENOKOT-S) 8.6-50 MG tablet, Take 2 tablets by mouth at bedtime. For AFTER surgery, do not take if having diarrhea (Patient not taking: Reported on 11/03/2020), Disp: 60 tablet, Rfl: 1  Review of Systems: Pertinent positives as per HPI. Denies appetite changes, fevers, chills, fatigue, unexplained weight changes. Denies hearing loss, neck lumps or masses, mouth sores, ringing in ears or voice changes. Denies cough or wheezing.  Denies shortness of breath. Denies chest pain or palpitations. Denies leg swelling. Denies abdominal distention, pain, blood in stools, constipation, diarrhea, nausea, vomiting, or early satiety. Denies pain with intercourse, dysuria, frequency, hematuria or incontinence. Denies hot flashes, pelvic pain, vaginal bleeding or vaginal discharge.   Denies joint pain, back pain or muscle pain/cramps. Denies itching, rash, or wounds. Denies dizziness, headaches, numbness or seizures. Denies swollen lymph nodes or glands, denies easy bruising or bleeding. Denies anxiety, depression, confusion, or decreased concentration.  Physical Exam: BP 115/70 (BP Location: Right Arm, Patient Position: Sitting)   Pulse 83   Temp 97.6 F (36.4 C) (Tympanic)   Resp 16   Ht 5\' 5"  (1.651 m)   Wt 135 lb 12.8 oz (61.6 kg)   SpO2 100%   BMI 22.60 kg/m  General: Alert, oriented, no acute distress. HEENT: Posterior oropharynx clear, sclera anicteric. Chest: Unlabored breathing on room  air. Extremities: Grossly normal range of motion.  Warm, well perfused.  No edema bilaterally. GU: External genitalia notable for a wound that is approximately 5 cm in length and now 3 cm deep.  Wound bed is healthy appearing without any purulent discharge.  The tissue itself is beefy red and is healthy in appearance.  Packing removed and new packing placed.  There are some mild induration over the right buttocks inferior to the healing wound.  The patient has no tenderness with palpation over this and there is no erythema or warmth to the touch.  Vaginal exam reveals no tenderness with palpation and no induration appreciated along the right labia and vulva.  There is some white discharge along the labia minora and labia majora on the right side.  Laboratory & Radiologic Studies: None new  Assessment & Plan: Sabrina Mullins is a 35 y.o. woman with history of a ischio anal/ischio rectal abscess with tract excision attempted approximately 1 month ago complicated by  reinfection very soon after surgery, now status post exam under anesthesia with wound opening and washout healing by secondary intent.  The patient is doing very well with minimal symptoms including minimal pain.  She has been off antibiotics for a couple days now without any evidence of systemic infection.  Her wound bed looks excellent and is significantly smaller than it was 3 weeks ago at the time of surgery.  We discussed continued wound care as the wound bed continues to become more superficial.  Patient has symptoms and exam findings concerning for Candida infection, which would be common in the setting of prolonged antibiotic use.  I sent a one-time dose of Diflucan to her pharmacy.  She is interested in being on oral contraceptive pills to help prevent menses this upcoming month.  I sent in a prescription for combination OCP the we discussed taking it in a continuous manner to hopefully prevent menses.  We will plan for follow-up in 1  month as I suspect she will be completely or nearly healed by that time.  Was given a work to return to note with some restrictions in terms of sitting for long periods of time.  22 minutes of total time was spent for this patient encounter, including preparation, face-to-face counseling with the patient and coordination of care, and documentation of the encounter.  Eugene Garnet, MD  Division of Gynecologic Oncology  Department of Obstetrics and Gynecology  [facility]

## 2020-11-17 ENCOUNTER — Encounter: Payer: 59 | Admitting: Gynecologic Oncology

## 2020-11-21 ENCOUNTER — Encounter: Payer: Self-pay | Admitting: Gynecologic Oncology

## 2020-11-22 MED ORDER — FLUCONAZOLE 150 MG PO TABS: 150.0000 mg | ORAL_TABLET | Freq: Every day | ORAL | 0 refills | Status: AC

## 2020-11-29 ENCOUNTER — Telehealth: Payer: Self-pay | Admitting: *Deleted

## 2020-11-29 NOTE — Telephone Encounter (Signed)
Sabrina Mullins reports vaginal itching and discomfort along with a "white spot" in her vaginal. She states that she has taken pills for yeast, which made her feel more comfortable, but did not make the "white spot" go away. She is not interested in taking another pill. Sabrina Mullins also states that she is changing the dressing twice  day. There is some yellow/green discharge on the gauze and and a sm amt of blood on the guaze int eh morning. She describes the wound as "hard". Dr. Pricilla Holm notified. Per Dr. Pricilla Holm, she can start some monistat and we will see her in the office on friday. Pt is agreeable with this plan.

## 2020-12-01 ENCOUNTER — Inpatient Hospital Stay: Payer: 59 | Admitting: Gynecologic Oncology

## 2020-12-01 ENCOUNTER — Other Ambulatory Visit: Payer: Self-pay

## 2020-12-01 ENCOUNTER — Ambulatory Visit: Payer: 59 | Admitting: Gynecologic Oncology

## 2020-12-01 VITALS — BP 110/66 | HR 83 | Temp 97.9°F | Resp 16 | Ht 65.0 in | Wt 141.8 lb

## 2020-12-01 DIAGNOSIS — N764 Abscess of vulva: Secondary | ICD-10-CM | POA: Diagnosis not present

## 2020-12-01 DIAGNOSIS — Z9889 Other specified postprocedural states: Secondary | ICD-10-CM | POA: Diagnosis not present

## 2020-12-01 DIAGNOSIS — N898 Other specified noninflammatory disorders of vagina: Secondary | ICD-10-CM

## 2020-12-01 DIAGNOSIS — B373 Candidiasis of vulva and vagina: Secondary | ICD-10-CM

## 2020-12-01 NOTE — Progress Notes (Signed)
Gynecologic Oncology Return Clinic Visit  12/01/2020  Reason for Visit: Vulvar symptoms  Interval History: Last saw the patient on 7/12.  She was doing well at that time with wound healing nicely.  On her exam at that time and symptoms, I treated her for a presumed yeast infection.  She had improvement of her pruritus but then redeveloped symptoms.  I sent in another prescription for Diflucan which again resolved her symptoms but they recurred.  She took her last Diflucan last Sunday and she began having pruritus of the outer vagina and vulvar area on Wednesday.  She started a 3-day course of Monistat yesterday and has noted some improvement but not complete relief of her symptoms.  For the last day or 2 she has noted some swelling of her right buttocks near her incision.  This seems to be worse at the end of the day and improves overnight.  She and her husband feel that the somewhat firm around the incision.  There is minimal spotting from her incision, no discharge.  She does have some vaginal discharge.  Past Medical/Surgical History: Past Medical History:  Diagnosis Date   Vulvar abscess 2022   Wears glasses 10/19/2020   Wound infection after surgery 11/01/2020    Past Surgical History:  Procedure Laterality Date   CESAREAN SECTION  02/2016   INCISION AND DRAINAGE ABSCESS N/A 10/25/2020   Procedure: VULVA  ABSCESS TRACT EXCISION;  Surgeon: Carver Fila, MD;  Location: [facility];  Service: Gynecology;  Laterality: N/A;   INCISION AND DRAINAGE PERIRECTAL ABSCESS Right 07/26/2020   Procedure: IRRIGATION AND DEBRIDEMENT right labial ABSCESS;  Surgeon: Fritzi Mandes, MD;  Location: [facility] OR;  Service: General;  Laterality: Right;   VULVAR LESION REMOVAL N/A 11/02/2020   Procedure: Irrigation and Drainage of VULVAR LESION;  Surgeon: Carver Fila, MD;  Location: [facility] [place];  Service: Gynecology;  Laterality: N/A;    Family History  Problem Relation  Age of Onset   Healthy Mother    Diabetes Father    Cancer Maternal Grandmother    Breast cancer Maternal Grandmother    Cancer Maternal Grandfather    Lung cancer Maternal Grandfather    Healthy Paternal Grandmother    Heart disease Paternal Grandmother    Healthy Paternal Grandfather    Colon cancer Neg Hx    Ovarian cancer Neg Hx    Endometrial cancer Neg Hx    Pancreatic cancer Neg Hx    Prostate cancer Neg Hx     Social History   Socioeconomic History   Marital status: Married    Spouse name: Not on file   Number of children: Not on file   Years of education: Not on file   Highest education level: Not on file  Occupational History   Occupation: Airline pilot  Tobacco Use   Smoking status: Never   Smokeless tobacco: Never  Vaping Use   Vaping Use: Never used  Substance and Sexual Activity   Alcohol use: Never   Drug use: Never   Sexual activity: Not Currently    Birth control/protection: Condom  Other Topics Concern   Not on file  Social History Narrative   Not on file   Social Determinants of Health   Financial Resource Strain: Not on file  Food Insecurity: Not on file  Transportation Needs: Not on file  Physical Activity: Not on file  Stress: Not on file  Social Connections: Not on file    Current Medications:  Current Outpatient Medications:    miconazole (MONISTAT 7) 2 % vaginal cream, Place 1 Applicatorful vaginally at bedtime., Disp: , Rfl:    Multiple Vitamin (MULTIVITAMIN WITH MINERALS) TABS tablet, Take 1 tablet by mouth daily., Disp: , Rfl:    naproxen (NAPROSYN) 500 MG tablet, Take 1 tablet (500 mg total) by mouth daily as needed for moderate pain., Disp: 30 tablet, Rfl: 1   norethindrone-ethinyl estradiol (LOESTRIN 1/20, 21,) 1-20 MG-MCG tablet, Take 1 tablet by mouth daily. During months that you would like to skip your period, don't take the sugar pills, Disp: 28 tablet, Rfl: 11   omega-3 acid ethyl esters (LOVAZA) 1 g capsule, Take 1 g by  mouth daily. (Patient not taking: Reported on 11/03/2020), Disp: , Rfl:    ondansetron (ZOFRAN-ODT) 4 MG disintegrating tablet, Take 4 mg by mouth every 8 (eight) hours as needed. (Patient not taking: Reported on 11/03/2020), Disp: , Rfl:    polyethylene glycol (MIRALAX / GLYCOLAX) 17 g packet, Take 17 g by mouth daily., Disp: , Rfl:   Review of Systems: Denies appetite changes, fevers, chills, fatigue, unexplained weight changes. Denies hearing loss, neck lumps or masses, mouth sores, ringing in ears or voice changes. Denies cough or wheezing.  Denies shortness of breath. Denies chest pain or palpitations. Denies leg swelling. Denies abdominal distention, pain, blood in stools, constipation, diarrhea, nausea, vomiting, or early satiety. Denies pain with intercourse, dysuria, frequency, hematuria or incontinence. Denies hot flashes, pelvic pain, vaginal bleeding or vaginal discharge.   Denies joint pain, back pain or muscle pain/cramps. Denies dizziness, headaches, numbness or seizures. Denies swollen lymph nodes or glands, denies easy bruising or bleeding. Denies anxiety, depression, confusion, or decreased concentration.  Physical Exam: BP 110/66 (BP Location: Left Arm, Patient Position: Sitting)   Pulse 83   Temp 97.9 F (36.6 C) (Tympanic)   Resp 16   Ht 5\' 5"  (1.651 m)   Wt 141 lb 12.8 oz (64.3 kg)   SpO2 100%   BMI 23.60 kg/m  General: Alert, oriented, no acute distress. HEENT: Normocephalic, atraumatic, sclera anicteric. Chest: Labored breathing on room air. Extremities: Grossly normal range of motion.  Warm, well perfused.  No edema bilaterally. GU: More lateral and inferior portion of the wound bed has basically healed shut.  There is some hyperpigmentation around the wound itself and mild induration consistent with scar tissue and the superficial tissue underneath the wound.  The area where the more lateral incision meets the incision along the outer vagina is still open  measuring approximately 1 x 1-1/2 cm.  There are some granulation tissue in the wound bed, no drainage or erythema noted.  There is no induration of the more surrounding area or buttocks.  The patient has some discomfort on initial intravaginal exam.  Speculum exam then performed with increased white discharge noted.  Wet prep collected to test for bacterial vaginosis and Candida.  On repeat vaginal exam, no pain with palpation.  Rectovaginal exam is well-tolerated and no evidence of induration or fluctuance near the rectum.  Laboratory & Radiologic Studies: None new  Assessment & Plan: Sabrina Mullins is a 35 y.o. woman with history of a ischio anal/ischio rectal abscess with tract excision attempted approximately 1 month ago complicated by reinfection very soon after surgery, now status post exam under anesthesia with wound opening and washout healing by secondary intent.  Patient's wound continues to heal well.  Is almost completely closed.  I discussed with her that I think the firmness that  she feels under the incision is scar tissue related to healing and that hopefully with time this will improve.  I do not feel any areas of fluctuance or that are concerning for recollection of an abscess.  Given persistent pruritic symptoms, I collected a swab for bacterial vaginosis and yeast today.  My suspicion is that she has continued Candida given improvement with yeast medication.  Depending on the results of her swab, then we will likely try a multi tablet regimen to treat her yeast over 4-5 doses.  We also discussed using some sitz bath's to see if this helps with her irritation.  32 minutes of total time was spent for this patient encounter, including preparation, face-to-face counseling with the patient and coordination of care, and documentation of the encounter.  Eugene Garnet, MD  Division of Gynecologic Oncology  Department of Obstetrics and Gynecology  [facility]

## 2020-12-01 NOTE — Patient Instructions (Signed)
It was good to see you today.  Your wound is healing well.  I will call you or send the results by MyChart when I get them back from the swab today, hopefully Monday.  I would finish the course of Monistat that you are doing.  You can start doing some sitz bath's if you want (this means soaking in salt water for 10-15 minutes).

## 2020-12-04 ENCOUNTER — Encounter: Payer: Self-pay | Admitting: Gynecologic Oncology

## 2020-12-04 NOTE — Telephone Encounter (Signed)
Sabrina Mullins states that she is not taking the sugar pills . The pharmacy removed them.  Told her that Warner Mccreedy, NP stated that the first months of taking the birthcontrol pills there can be breakthrough bleeding.  Keep taking the [facility] pills as directed. Another test needs to be done to check for vaginal yeast.  This needs to be done when she is not bleeding.  She needs to call the office when the bleeding stops to make an appointment for another vaginal swabbing. Pt verbalized understanding.

## 2020-12-18 ENCOUNTER — Encounter: Payer: Self-pay | Admitting: Gynecologic Oncology

## 2020-12-18 ENCOUNTER — Other Ambulatory Visit: Payer: Self-pay

## 2020-12-18 ENCOUNTER — Inpatient Hospital Stay: Payer: 59 | Attending: Gynecologic Oncology | Admitting: Gynecologic Oncology

## 2020-12-18 ENCOUNTER — Inpatient Hospital Stay: Payer: 59

## 2020-12-18 VITALS — BP 113/65 | HR 93 | Temp 96.8°F | Resp 18 | Ht 65.0 in | Wt 137.0 lb

## 2020-12-18 DIAGNOSIS — N764 Abscess of vulva: Secondary | ICD-10-CM | POA: Insufficient documentation

## 2020-12-18 DIAGNOSIS — N898 Other specified noninflammatory disorders of vagina: Secondary | ICD-10-CM | POA: Diagnosis not present

## 2020-12-18 DIAGNOSIS — L24A9 Irritant contact dermatitis due friction or contact with other specified body fluids: Secondary | ICD-10-CM

## 2020-12-18 DIAGNOSIS — K6289 Other specified diseases of anus and rectum: Secondary | ICD-10-CM | POA: Diagnosis not present

## 2020-12-18 LAB — WET PREP, GENITAL
Clue Cells Wet Prep HPF POC: NONE SEEN
Sperm: NONE SEEN
Trich, Wet Prep: NONE SEEN
Yeast Wet Prep HPF POC: NONE SEEN

## 2020-12-18 NOTE — Progress Notes (Signed)
Gynecologic Oncology Return Clinic Visit  12/18/2020  Reason for Visit: Follow-up vulvar incision  Interval History: Patient reports doing well until Friday.  She was having very minimal if any pain but starting Friday has noted some pain near her anus/rectum as well as some swelling.  She notes having the symptoms when she is walking.  Denies symptoms when she is sitting.  She still has some white discharge but the vaginal and vulvar irritation as well as pruritus have stopped.  She denies any bleeding.  She had a week of bleeding during her third week of birth control pills and subsequently stopped them.  She denies any bleeding since stopping the pills.  She reports regular bowel function, denies any constipation.  Denies any bladder symptoms.  Past Medical/Surgical History: Past Medical History:  Diagnosis Date   Vulvar abscess 2022   Wears glasses 10/19/2020   Wound infection after surgery 11/01/2020    Past Surgical History:  Procedure Laterality Date   CESAREAN SECTION  02/2016   INCISION AND DRAINAGE ABSCESS N/A 10/25/2020   Procedure: VULVA  ABSCESS TRACT EXCISION;  Surgeon: Carver Fila, MD;  Location: [facility];  Service: Gynecology;  Laterality: N/A;   INCISION AND DRAINAGE PERIRECTAL ABSCESS Right 07/26/2020   Procedure: IRRIGATION AND DEBRIDEMENT right labial ABSCESS;  Surgeon: Fritzi Mandes, MD;  Location: [facility] OR;  Service: General;  Laterality: Right;   VULVAR LESION REMOVAL N/A 11/02/2020   Procedure: Irrigation and Drainage of VULVAR LESION;  Surgeon: Carver Fila, MD;  Location: [facility];  Service: Gynecology;  Laterality: N/A;    Family History  Problem Relation Age of Onset   Healthy Mother    Diabetes Father    Cancer Maternal Grandmother    Breast cancer Maternal Grandmother    Cancer Maternal Grandfather    Lung cancer Maternal Grandfather    Healthy Paternal Grandmother    Heart disease Paternal Grandmother     Healthy Paternal Grandfather    Colon cancer Neg Hx    Ovarian cancer Neg Hx    Endometrial cancer Neg Hx    Pancreatic cancer Neg Hx    Prostate cancer Neg Hx     Social History   Socioeconomic History   Marital status: Married    Spouse name: Not on file   Number of children: Not on file   Years of education: Not on file   Highest education level: Not on file  Occupational History   Occupation: Airline pilot  Tobacco Use   Smoking status: Never   Smokeless tobacco: Never  Vaping Use   Vaping Use: Never used  Substance and Sexual Activity   Alcohol use: Never   Drug use: Never   Sexual activity: Not Currently    Birth control/protection: Condom  Other Topics Concern   Not on file  Social History Narrative   Not on file   Social Determinants of Health   Financial Resource Strain: Not on file  Food Insecurity: Not on file  Transportation Needs: Not on file  Physical Activity: Not on file  Stress: Not on file  Social Connections: Not on file    Current Medications:  Current Outpatient Medications:    miconazole (MONISTAT 7) 2 % vaginal cream, Place 1 Applicatorful vaginally at bedtime., Disp: , Rfl:    Multiple Vitamin (MULTIVITAMIN WITH MINERALS) TABS tablet, Take 1 tablet by mouth daily., Disp: , Rfl:    naproxen (NAPROSYN) 500 MG tablet, Take 1 tablet (500 mg  total) by mouth daily as needed for moderate pain., Disp: 30 tablet, Rfl: 1   norethindrone-ethinyl estradiol (LOESTRIN 1/20, 21,) 1-20 MG-MCG tablet, Take 1 tablet by mouth daily. During months that you would like to skip your period, don't take the sugar pills, Disp: 28 tablet, Rfl: 11   omega-3 acid ethyl esters (LOVAZA) 1 g capsule, Take 1 g by mouth daily. (Patient not taking: Reported on 11/03/2020), Disp: , Rfl:    ondansetron (ZOFRAN-ODT) 4 MG disintegrating tablet, Take 4 mg by mouth every 8 (eight) hours as needed. (Patient not taking: Reported on 11/03/2020), Disp: , Rfl:    polyethylene glycol  (MIRALAX / GLYCOLAX) 17 g packet, Take 17 g by mouth daily., Disp: , Rfl:   Review of Systems: Denies appetite changes, fevers, chills, fatigue, unexplained weight changes. Denies hearing loss, neck lumps or masses, mouth sores, ringing in ears or voice changes. Denies cough or wheezing.  Denies shortness of breath. Denies chest pain or palpitations. Denies leg swelling. Denies abdominal distention, pain, blood in stools, constipation, diarrhea, nausea, vomiting, or early satiety. Denies pain with intercourse, dysuria, frequency, hematuria or incontinence. Denies joint pain, back pain or muscle pain/cramps. Denies itching, rash, or wounds. Denies dizziness, headaches, numbness or seizures. Denies swollen lymph nodes or glands, denies easy bruising or bleeding. Denies anxiety, depression, confusion, or decreased concentration.  Physical Exam: BP 113/65 (BP Location: Left Arm, Patient Position: Sitting)   Pulse 93   Temp (!) 96.8 F (36 C) (Tympanic)   Resp 18   Ht 5\' 5"  (1.651 m)   Wt 137 lb (62.1 kg)   SpO2 98%   BMI 22.80 kg/m  General: Alert, oriented, no acute distress. HEENT: Normocephalic, atraumatic, sclera anicteric. Chest: Unlabored breathing on room air. Abdomen: soft, nontender.  Normoactive bowel sounds.  No masses or hepatosplenomegaly appreciated.   Extremities: Grossly normal range of motion.  Warm, well perfused.  No edema bilaterally. Skin: No rashes or lesions noted. GU: External genitalia notable for an almost completely healed incision over the right buttocks that extends from the posterior aspect of the vagina.  Near to the vagina, there is a 1 cm area of polypoid looking tissue just superior to the healed incision.  With some manipulation of this area there is very scant discharge expelled from the incision itself.  A small Q-tip was used to gently open the incision and a culture was collected from the incision given mild purulent appearance of this fluid.   Further attempts at expression of drainage from the incision were unsuccessful.  The induration and presumed scar under the incision itself has improved over the last several weeks since her last exam.  On vaginal exam, there are no areas of fluctuance or tenderness.  On rectovaginal exam, the patient is quite tender with palpation along the right aspect of the rectum as well as between the rectum and vagina.  I cannot feel an obvious area of fluctuance.    Laboratory & Radiologic Studies: None new  Assessment & Plan: Sabrina Mullins is a 35 y.o. 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  Overall, the patient's wound has continued to heal and is almost completely healed at this time.  Small opening was made along the incision where slight drainage was  noted.  A culture of this was sent.  Additionally, given some persistent white discharge, wet prep was performed today for Candida and bacterial vaginosis.  I am somewhat concerned given tenderness on her rectal exam as well as several days of new symptoms regarding the possibility of a new fluid collection.  I cannot appreciate 1 on exam, but given location of her last abscess, I worry that she may have a small fistula with the rectum.  I am going to repeat an MRI of the pelvis.  Depending on findings, we discussed that she may need to go back to see Dr. Cliffton Asters at [facility] surgery.  32 minutes of total time was spent for this patient encounter, including preparation, face-to-face counseling with the patient and coordination of care, and documentation of the encounter.  Eugene Garnet, MD  Division of Gynecologic Oncology  Department of Obstetrics and Gynecology  [facility]  [facility]

## 2020-12-18 NOTE — Patient Instructions (Signed)
I will call you once I get the tests from today.  After I see your MRI, I will call you to discuss results. If there is a new fluid collection near the rectum, then we will get you back in to see Dr. Cliffton Asters.

## 2020-12-20 ENCOUNTER — Other Ambulatory Visit: Payer: Self-pay | Admitting: Gynecologic Oncology

## 2020-12-20 ENCOUNTER — Telehealth: Payer: Self-pay | Admitting: *Deleted

## 2020-12-20 DIAGNOSIS — B379 Candidiasis, unspecified: Secondary | ICD-10-CM

## 2020-12-20 DIAGNOSIS — N764 Abscess of vulva: Secondary | ICD-10-CM

## 2020-12-20 DIAGNOSIS — L24A9 Irritant contact dermatitis due friction or contact with other specified body fluids: Secondary | ICD-10-CM

## 2020-12-20 LAB — AEROBIC CULTURE W GRAM STAIN (SUPERFICIAL SPECIMEN): Gram Stain: NONE SEEN

## 2020-12-20 MED ORDER — MICONAZOLE NITRATE 2 % EX CREA: 1.0000 "application " | TOPICAL_CREAM | Freq: Two times a day (BID) | CUTANEOUS | 1 refills | Status: DC | PRN

## 2020-12-20 MED ORDER — FLUCONAZOLE 150 MG PO TABS: 150.0000 mg | ORAL_TABLET | ORAL | 1 refills | Status: DC

## 2020-12-20 MED ORDER — AMPICILLIN 500 MG PO CAPS: 500.0000 mg | ORAL_CAPSULE | Freq: Four times a day (QID) | ORAL | 0 refills | Status: AC

## 2020-12-20 NOTE — Telephone Encounter (Signed)
PC to patient, informed her the vulvar culture shows e coli.  Rx for ampicillin has been sent to her pharmacy, to be taken 4 times a day for 7 days.  Also advised patient to take probiotic along with antibiotic.  Rx for diflucan & topical cream for yeast has been sent to her pharmacy.  She is to start taking the diflucan 3 days after starting the ampicillin unless she is having symptoms of yeast infection sooner, may repeat dose in 3 days.  She may use the topical cream to vulva externally but not on incision.  Patient verbalizes understanding of all instructions.

## 2020-12-20 NOTE — Progress Notes (Signed)
See RN note. Wound culture from recent visit resulting rare e coli with sensitivities present. Dr. Pricilla Holm would like to treat. Ampicillin ordered based on sensitivities.

## 2020-12-25 ENCOUNTER — Ambulatory Visit (HOSPITAL_COMMUNITY)
Admission: RE | Admit: 2020-12-25 | Discharge: 2020-12-25 | Disposition: A | Discharge status: Home / Self Care | Payer: 59 | Source: Ambulatory Visit | Attending: Gynecologic Oncology | Admitting: Gynecologic Oncology

## 2020-12-25 ENCOUNTER — Other Ambulatory Visit: Payer: Self-pay

## 2020-12-25 DIAGNOSIS — K6289 Other specified diseases of anus and rectum: Secondary | ICD-10-CM | POA: Insufficient documentation

## 2020-12-25 DIAGNOSIS — K603 Anal fistula: Secondary | ICD-10-CM | POA: Diagnosis not present

## 2020-12-25 DIAGNOSIS — L02215 Cutaneous abscess of perineum: Secondary | ICD-10-CM | POA: Diagnosis not present

## 2020-12-25 DIAGNOSIS — N83201 Unspecified ovarian cyst, right side: Secondary | ICD-10-CM | POA: Diagnosis not present

## 2020-12-25 DIAGNOSIS — Z9889 Other specified postprocedural states: Secondary | ICD-10-CM | POA: Diagnosis not present

## 2020-12-25 IMAGING — MR MR PELVIS WO/W CM
5 of 8 series · 31 of 48 positions shown · IV contrast (gadavist)
Comparison: [DATE]

CLINICAL DATA: Recurrent rectal pain. Approximately 2 months status
post I&D of perineal abscess.

EXAM:
MRI PELVIS WITHOUT AND WITH CONTRAST
TECHNIQUE: Multiplanar multisequence MR imaging of the pelvis was performed
both before and after administration of intravenous contrast.
CONTRAST:  6mL GADAVIST GADOBUTROL 1 MMOL/ML IV SOLN

[Series 2: T2 fat-sat · sagittal · 2.5mm · 1.02mm/px · 8 of 73 slices shown (1 of 2)]
[im 1/73]
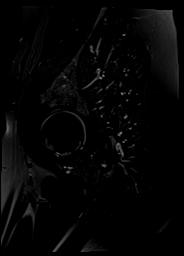
[im 11/73]
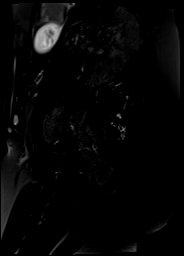
[im 21/73]
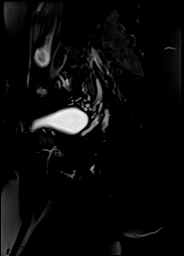
[im 31/73]
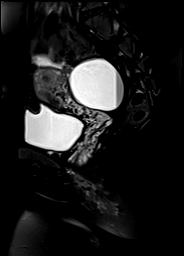
[im 42/73]
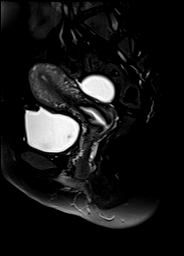
[im 52/73]
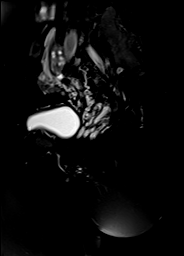
[im 62/73]
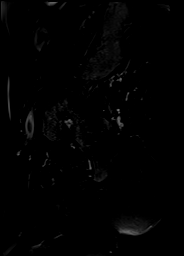
[im 73/73]
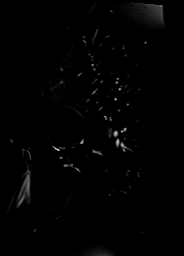

[Series 3: T2 · sagittal · 2.5mm · 1.02mm/px · 8 of 73 slices shown (1 of 2)]
[im 1/73]
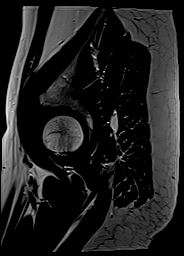
[im 11/73]
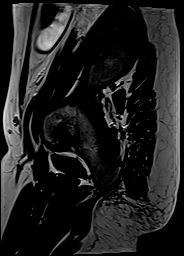
[im 21/73]
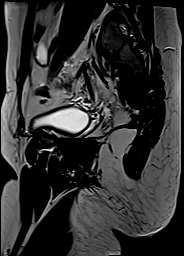
[im 31/73]
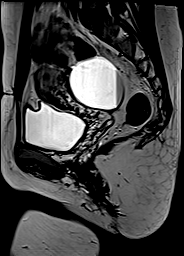
[im 42/73]
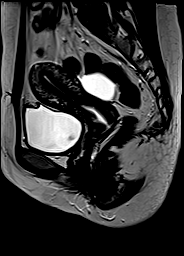
[im 52/73]
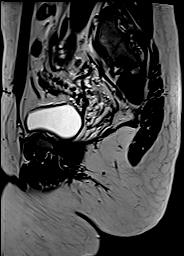
[im 62/73]
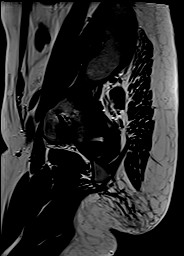
[im 73/73]
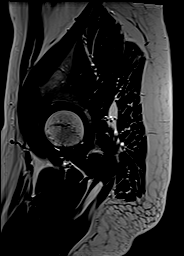

[Series 4: T2 · axial · 4.0mm · 0.43mm/px · z∈[-196,+44]mm · 6 of 51 slices shown (2 of 2)]
[im 1/51]
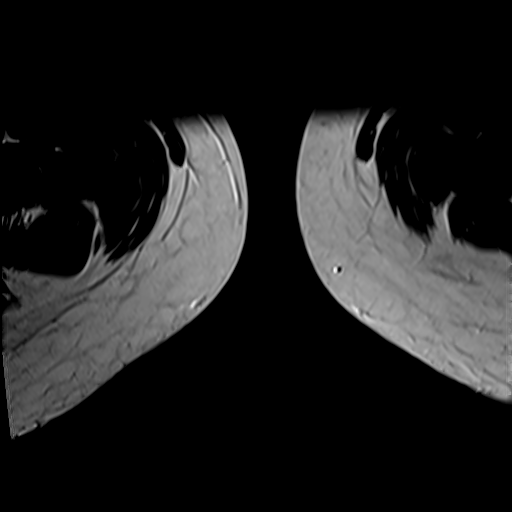
[im 11/51]
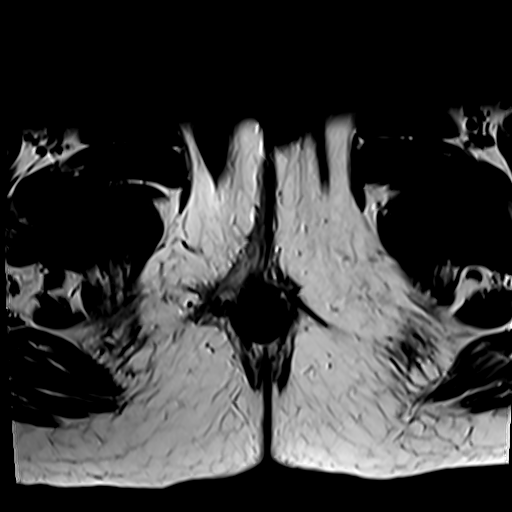
[im 21/51]
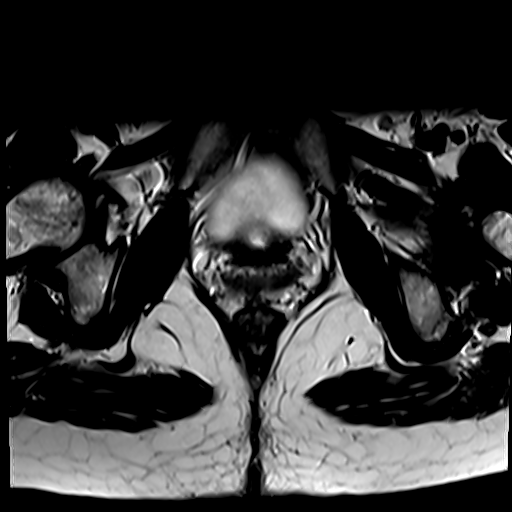
[im 31/51]
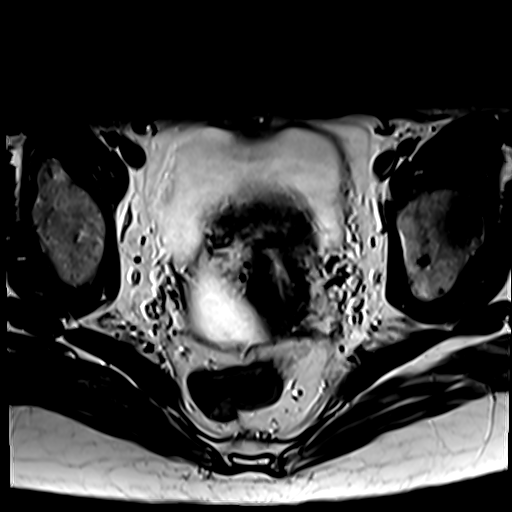
[im 41/51]
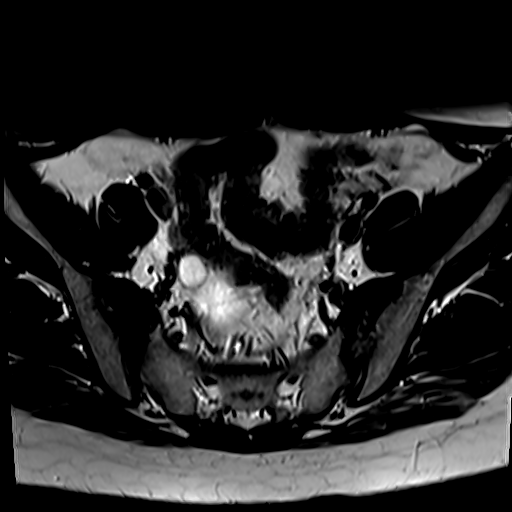
[im 51/51]
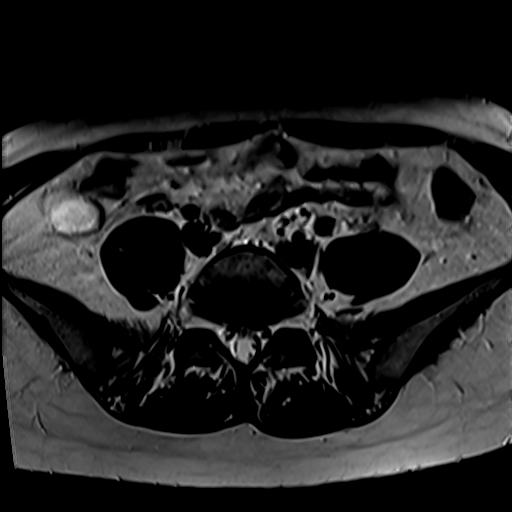

[Series 5: T1 · axial · 4.0mm · 0.43mm/px · z∈[-196,+44]mm · 6 of 51 slices shown]
[im 1/51]
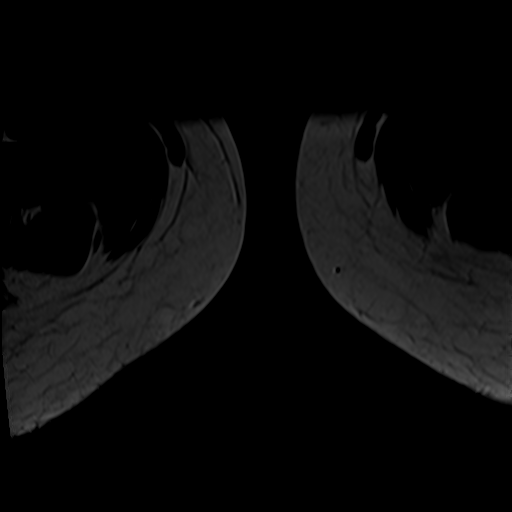
[im 11/51]
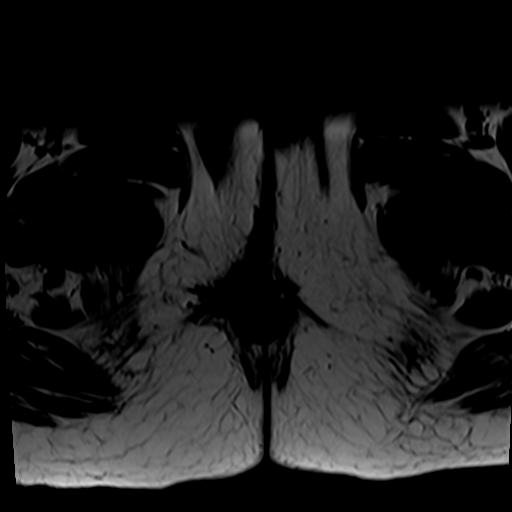
[im 21/51]
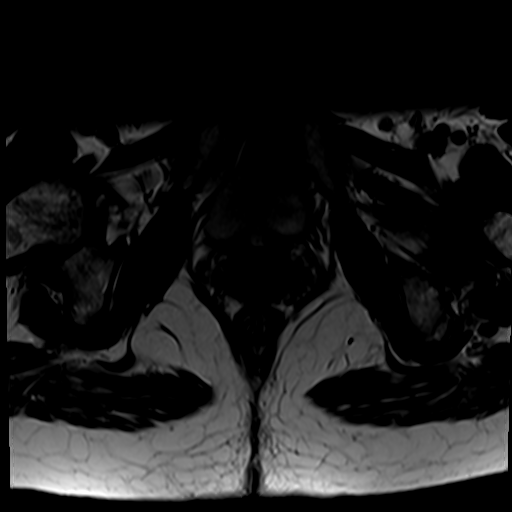
[im 31/51]
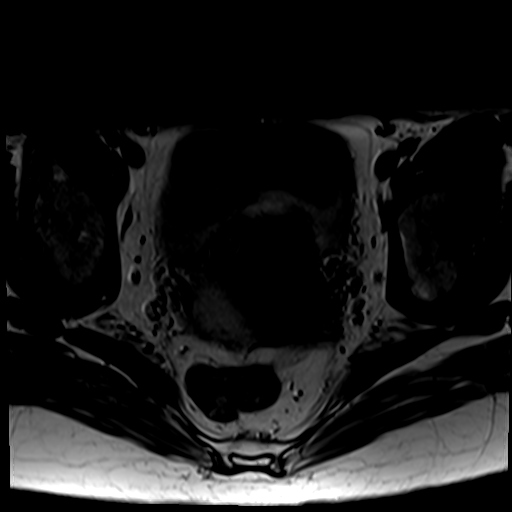
[im 41/51]
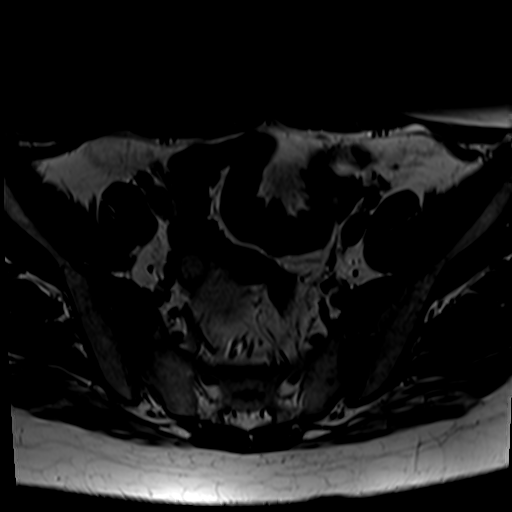
[im 51/51]
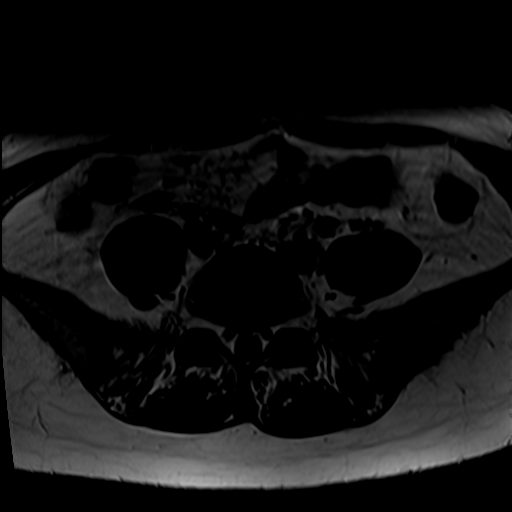

[Series 6: T2 fat-sat · axial · 4.0mm · 0.43mm/px · z∈[-196,-100]mm · 3 of 51 slices shown (2 of 2)]
[im 1/51]
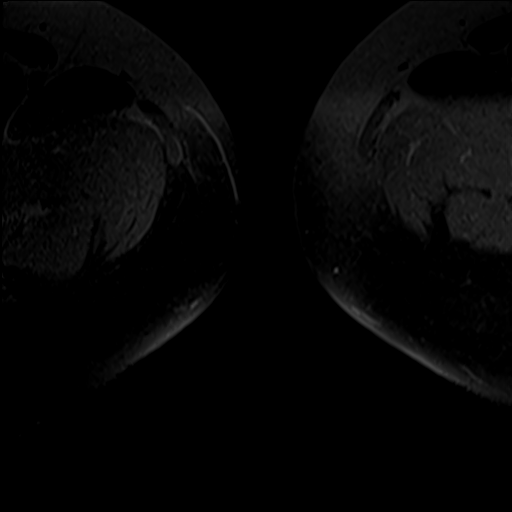
[im 11/51]
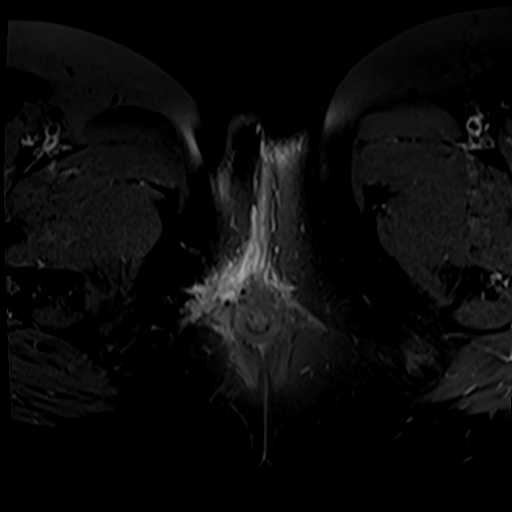
[im 21/51]
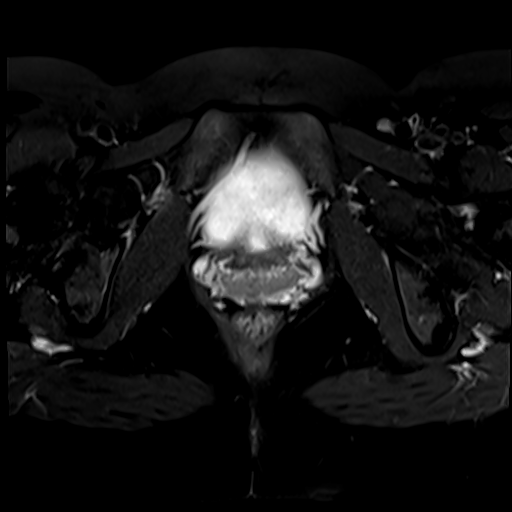

[31 of 48 positions shown; findings below may reference images not displayed]

FINDINGS: Lower Urinary Tract: Unremarkable urinary bladder and urethra.

Bowel: Rectum and other pelvic bowel loops are unremarkable. No
evidence of anal fistula.

Vascular/Lymphatic: No pathologically enlarged lymph nodes or other
significant abnormality seen in lower pelvis.

Reproductive: Normal size and appearance of the uterus. A large
hemorrhagic cyst is seen in the right adnexa measuring 5.9 x 5.6 cm.
This is new since previous study and consistent with a hemorrhagic
ovarian cyst. No evidence of free fluid.

Other:

Susceptibility artifact is seen in the right perineal soft tissues
from previous surgical I and D. Surrounding soft tissue enhancement
is seen in the right perineal soft tissues, extending inferiorly
from the vagina along the right lateral margin of the anus. This is
consistent with postoperative changes from recent I and D.

A short fistulous tract containing fluid is seen along the inferior
and anterior aspect of the anal sphincter extending into the gluteal
crease, measuring approximately 2 cm in length (e.g. Image [DATE]). No
evidence of abscess or communication with the anal sphincter.

Musculoskeletal: No significant abnormality identified.
IMPRESSION: Postsurgical changes from recent I&D of right perineal abscess.

Short fistulous tract along the inferior and right anterior aspect
of the anal sphincter, which extends into the gluteal crease. No
evidence of abscess or communication with the anal sphincter.

5.9 cm hemorrhagic right ovarian cyst, new since previous study and
most likely physiologic.

## 2020-12-25 MED ORDER — GADOBUTROL 1 MMOL/ML IV SOLN
6.0000 mL | Freq: Once | INTRAVENOUS | Status: AC | PRN
  Administered 2020-12-25: 6 mL via INTRAVENOUS

## 2020-12-26 ENCOUNTER — Telehealth: Payer: Self-pay | Admitting: Oncology

## 2020-12-26 ENCOUNTER — Encounter: Payer: Self-pay | Admitting: Gynecologic Oncology

## 2020-12-26 NOTE — Telephone Encounter (Signed)
Called [facility] and spoke to [place] regarding scheduling an appointment with Dr. Cliffton Asters.  She is gong to check with Dr. Cliffton Asters and call back with an appointment.

## 2020-12-26 NOTE — Telephone Encounter (Signed)
Sabrina Mullins called back and Sabrina Mullins has an appointment with Dr. Cliffton Asters tomorrow at 4:10.

## 2020-12-27 DIAGNOSIS — N360 Urethral fistula: Secondary | ICD-10-CM | POA: Diagnosis not present

## 2020-12-29 ENCOUNTER — Encounter: Payer: Self-pay | Admitting: Gynecologic Oncology

## 2020-12-29 NOTE — Telephone Encounter (Signed)
My Chart message sent to patient as noted below Fax to [facility] office is [phone].  Ms Klinkner. Your records have been sent to the [facility].  It went to the main office in [place], [place]. Phone [phone]. Requested they call you to set up an appointment. All referrals go to the main office. You may be seen sooner if you go to [facility] since they only are here 2 half days. Sincerely, Merri Ray

## 2021-01-03 DIAGNOSIS — K611 Rectal abscess: Secondary | ICD-10-CM | POA: Insufficient documentation

## 2021-01-10 NOTE — Progress Notes (Signed)
Sent message, via epic in basket, requesting orders in epic from surgeon.  

## 2021-01-11 ENCOUNTER — Ambulatory Visit: Payer: Self-pay | Admitting: Surgery

## 2021-01-14 DIAGNOSIS — Z20822 Contact with and (suspected) exposure to covid-19: Secondary | ICD-10-CM | POA: Diagnosis not present

## 2021-01-16 NOTE — Patient Instructions (Signed)
DUE TO COVID-19 ONLY ONE VISITOR IS ALLOWED TO COME WITH YOU AND STAY IN THE WAITING ROOM ONLY DURING PRE OP AND PROCEDURE DAY OF SURGERY IF YOU ARE GOING HOME AFTER SURGERY. IF YOU ARE SPENDING THE NIGHT 2 PEOPLE MAY VISIT WITH YOU IN YOUR PRIVATE ROOM AFTER SURGERY UNTIL VISITING  HOURS ARE OVER AT 800 PM AND THE 2 VISITORS CANNOT SPEND THE NIGHT.                 Daune Perch     Your procedure is scheduled on: 01/25/21   Report to [facility] Main  Entrance   Report to admitting at  10:15 AM     Call this number if you have problems the morning of surgery [phone]    Remember: Do not eat food  :After Midnight the night before your surgery,     You may have clear liquids from midnight until 9:30 AM    CLEAR LIQUID DIET   Foods Allowed                                                                     Foods Excluded                                                                                          NO MILK OR CREAMER Coffee and tea, regular and decaf                             liquids that you cannot  Plain Jell-O any favor except red or purple                                           see through such as: Fruit ices (not with fruit pulp)                                     milk, soups, orange juice  Iced Popsicles                                    All solid food Carbonated beverages, regular and diet                                    Cranberry, grape and apple juices Sports drinks like Gatorade Lightly seasoned clear broth or consume(fat free) Sugar    BRUSH YOUR TEETH MORNING OF SURGERY AND RINSE YOUR MOUTH OUT, NO CHEWING GUM CANDY OR MINTS.     Take these medicines the  morning of surgery with A SIP OF WATER: CONTRACEPTIVE                                 You may not have any metal on your body including hair pins and              piercings  Do not wear jewelry, make-up, lotions, powders or perfumes, deodorant             Do not wear nail polish on  your fingernails.  Do not shave  48 hours prior to surgery.                Do not bring valuables to the hospital. [facility] IS NOT             RESPONSIBLE   FOR VALUABLES.  Contacts, dentures or bridgework may not be worn into surgery.  Leave suitcase in the car. After surgery it may be brought to your room.     Patients discharged the day of surgery will not be allowed to drive home.  IF YOU ARE HAVING SURGERY AND GOING HOME THE SAME DAY, YOU MUST HAVE AN ADULT TO DRIVE YOU HOME AND BE WITH YOU FOR 24 HOURS. YOU MAY GO HOME BY TAXI OR UBER OR ORTHERWISE, BUT AN ADULT MUST ACCOMPANY YOU HOME AND STAY WITH YOU FOR 24 HOURS.  Name and phone number of your driver:  Special Instructions: N/A              Please read over the following fact sheets you were given: _____________________________________________________________________             [facility] - Preparing for Surgery Before surgery, you can play an important role.  Because skin is not sterile, your skin needs to be as free of germs as possible.  You can reduce the number of germs on your skin by washing with CHG (chlorahexidine gluconate) soap before surgery.  CHG is an antiseptic cleaner which kills germs and bonds with the skin to continue killing germs even after washing. Please DO NOT use if you have an allergy to CHG or antibacterial soaps.  If your skin becomes reddened/irritated stop using the CHG and inform your nurse when you arrive at Short Stay. Do not shave (including legs and underarms) for at least 48 hours prior to the first CHG shower.   Please follow these instructions carefully:  1.  Shower with CHG Soap the night before surgery and the  morning of Surgery.  2.  If you choose to wash your hair, wash your hair first as usual with your  normal  shampoo.  3.  After you shampoo, rinse your hair and body thoroughly to remove the  shampoo.                            4.  Use CHG as you would any other liquid soap.  You  can apply chg directly  to the skin and wash                       Gently with a scrungie or clean washcloth.  5.  Apply the CHG Soap to your body ONLY FROM THE NECK DOWN.   Do not use on face/ open  Wound or open sores. Avoid contact with eyes, ears mouth and genitals (private parts).                       Wash face,  Genitals (private parts) with your normal soap.             6.  Wash thoroughly, paying special attention to the area where your surgery  will be performed.  7.  Thoroughly rinse your body with warm water from the neck down.  8.  DO NOT shower/wash with your normal soap after using and rinsing off  the CHG Soap.                9.  Pat yourself dry with a clean towel.            10.  Wear clean pajamas.            11.  Place clean sheets on your bed the night of your first shower and do not  sleep with pets. Day of Surgery : Do not apply any lotions/deodorants the morning of surgery.  Please wear clean clothes to the hospital/surgery center.  FAILURE TO FOLLOW THESE INSTRUCTIONS MAY RESULT IN THE CANCELLATION OF YOUR SURGERY PATIENT SIGNATURE_________________________________  NURSE SIGNATURE__________________________________  ________________________________________________________________________

## 2021-01-17 ENCOUNTER — Other Ambulatory Visit: Payer: Self-pay

## 2021-01-17 ENCOUNTER — Encounter (HOSPITAL_COMMUNITY): Payer: Self-pay

## 2021-01-17 ENCOUNTER — Encounter (HOSPITAL_COMMUNITY)
Admission: RE | Admit: 2021-01-17 | Discharge: 2021-01-17 | Disposition: A | Discharge status: Home / Self Care | Payer: 59 | Source: Ambulatory Visit | Attending: Surgery | Admitting: Surgery

## 2021-01-17 NOTE — Progress Notes (Addendum)
PAT visit was via phone. Pt sick.  COVID test Completed:NA AMB P   PCP - Dr. Judie Petit. [facility] LOV 07/28/20 Cardiologist - no  Chest x-ray - no EKG - no Stress Test - no ECHO - no Cardiac Cath - no Pacemaker/ICD device last checked:NA  Sleep Study - no CPAP -   Fasting Blood Sugar - NA Checks Blood Sugar _____ times a day  Blood Thinner Instructions:no Aspirin Instructions: Last Dose:  Anesthesia review: yes  Patient denies shortness of breath, fever, cough and chest pain at PAT appointment Pt has no SOB with any activities. PAT visit was done by phone. She has a fever of 99.6 with body aches and sore throat. Her children tested + for covid on 01/10/21.  She tested negative on 01/17/21. I told her to repeat her Covid test in a few days and call Dr. Cliffton Asters if fever continues or if she tests + for covid.  Patient verbalized understanding of instructions that were given to them at the PAT appointment. Patient was also instructed that they will need to review over the PAT instructions again at home before surgery. I read the instructions to her and she took notes. She indicated complete understanding.

## 2021-01-21 DIAGNOSIS — Z20822 Contact with and (suspected) exposure to covid-19: Secondary | ICD-10-CM | POA: Diagnosis not present

## 2021-01-26 DIAGNOSIS — N360 Urethral fistula: Secondary | ICD-10-CM | POA: Diagnosis not present

## 2021-01-26 DIAGNOSIS — Z9889 Other specified postprocedural states: Secondary | ICD-10-CM | POA: Diagnosis not present

## 2021-02-01 ENCOUNTER — Encounter: Payer: 59 | Admitting: Obstetrics and Gynecology

## 2021-02-05 NOTE — Patient Instructions (Addendum)
DUE TO COVID-19 ONLY ONE VISITOR IS ALLOWED TO COME WITH YOU AND STAY IN THE WAITING ROOM ONLY DURING PRE OP AND PROCEDURE DAY OF SURGERY.   TWO VISITOR  MAY VISIT WITH YOU AFTER SURGERY IN YOUR PRIVATE ROOM DURING VISITING HOURS ONLY!  YOU NEED TO HAVE A COVID 19 TEST On_not needed______ @_______ , THIS TEST MUST BE DONE BEFORE SURGERY,     Please bring completed form with you to the COVID testing site   COVID TESTING SITE [address] [place]   [phone]      ONCE YOUR COVID TEST IS COMPLETED,  PLEASE Wear a mask when in public           Your procedure is scheduled on: 02-22-21   Report to [facility]  Entrance   Report to admitting at      0815 AM     Call this number if you have problems the morning of surgery [phone]   Remember:one fleets enema the night before surgery and one the AM of surgery.   Do not eat food  :After Midnight.you may have clear liquids until 0730 am then nothing by mouth  CLEAR LIQUID DIET                                                                    water Black Coffee and tea, regular and decaf                             Plain Jell-O any favor except red or purple                                  Fruit ices (not with fruit pulp)                                      Iced Popsicles                                                                      Cranberry, grape and apple juices Sports drinks like Gatorade Lightly seasoned clear broth or consume(fat free) Sugar, honey syrup   _____________________________________________________________________     BRUSH YOUR TEETH MORNING OF SURGERY AND RINSE YOUR MOUTH OUT, NO CHEWING GUM CANDY OR MINTS.     Take these medicines the morning of surgery with A SIP OF WATER: NONE                                You may not have any metal on your body including hair pins and              piercings  Do not wear jewelry, make-up, lotions, powders,perfumes, or  deodorant  Do not wear nail polish on your fingernails or toenails .  Do not shave  48 hours prior to surgery.               Do not bring valuables to the hospital. [facility] IS NOT             RESPONSIBLE   FOR VALUABLES.  Contacts, dentures or bridgework may not be worn into surgery.       Patients discharged the day of surgery will not be allowed to drive home. IF YOU ARE HAVING SURGERY AND GOING HOME THE SAME DAY, YOU MUST HAVE AN ADULT TO DRIVE YOU HOME AND BE WITH YOU FOR 24 HOURS. YOU MAY GO HOME BY TAXI OR UBER OR ORTHERWISE, BUT AN ADULT MUST ACCOMPANY YOU HOME AND STAY WITH YOU FOR 24 HOURS.  Name and phone number of your driver:  Special Instructions: N/A              Please read over the following fact sheets you were given: _____________________________________________________________________             [facility] - Preparing for Surgery Before surgery, you can play an important role.  Because skin is not sterile, your skin needs to be as free of germs as possible.  You can reduce the number of germs on your skin by washing with CHG (chlorahexidine gluconate) soap before surgery.  CHG is an antiseptic cleaner which kills germs and bonds with the skin to continue killing germs even after washing. Please DO NOT use if you have an allergy to CHG or antibacterial soaps.  If your skin becomes reddened/irritated stop using the CHG and inform your nurse when you arrive at Short Stay. Do not shave (including legs and underarms) for at least 48 hours prior to the first CHG shower.  You may shave your face/neck. Please follow these instructions carefully:  1.  Shower with CHG Soap the night before surgery and the  morning of Surgery.  2.  If you choose to wash your hair, wash your hair first as usual with your  normal  shampoo.  3.  After you shampoo, rinse your hair and body thoroughly to remove the  shampoo.                           4.  Use CHG as you would any other  liquid soap.  You can apply chg directly  to the skin and wash                       Gently with a scrungie or clean washcloth.  5.  Apply the CHG Soap to your body ONLY FROM THE NECK DOWN.   Do not use on face/ open                           Wound or open sores. Avoid contact with eyes, ears mouth and genitals (private parts).                       Wash face,  Genitals (private parts) with your normal soap.             6.  Wash thoroughly, paying special attention to the area where your surgery  will be performed.  7.  Thoroughly rinse your body with warm water from the neck down.  8.  DO NOT shower/wash with your normal soap after using and rinsing off  the CHG Soap.                9.  Don yourself dry with a clean towel.            10.  Wear clean pajamas.            11.  Place clean sheets on your bed the night of your first shower and do not  sleep with pets. Day of Surgery : Do not apply any lotions/deodorants the morning of surgery.  Please wear clean clothes to the hospital/surgery center.  FAILURE TO FOLLOW THESE INSTRUCTIONS MAY RESULT IN THE CANCELLATION OF YOUR SURGERY PATIENT SIGNATURE_________________________________  NURSE SIGNATURE__________________________________  ________________________________________________________________________

## 2021-02-05 NOTE — Progress Notes (Addendum)
PCP - Leavy Cella , MD Cardiologist - no  PPM/ICD -  Device Orders -  Rep Notified -   Chest x-ray -  EKG -  Stress Test -  ECHO -  Cardiac Cath -   Sleep Study -  CPAP -   Fasting Blood Sugar -  Checks Blood Sugar _____ times a day  Blood Thinner Instructions: Aspirin Instructions:  ERAS Protcol - PRE-SURGERY Ensure or G2-   COVID TEST- Ambulatory also COVID + 01-21-21 COVID vaccine -fully vaccinated x 2   Activity--Able to walk a flight of stairs without SOB or CP Anesthesia review:   Patient denies shortness of breath, fever, cough and chest pain at PAT appointment   All instructions explained to the patient, with a verbal understanding of the material. Patient agrees to go over the instructions while at home for a better understanding. Patient also instructed to self quarantine after being tested for COVID-19. The opportunity to ask questions was provided.

## 2021-02-07 ENCOUNTER — Encounter (HOSPITAL_COMMUNITY)
Admission: RE | Admit: 2021-02-07 | Discharge: 2021-02-07 | Disposition: A | Discharge status: Home / Self Care | Payer: 59 | Source: Ambulatory Visit | Attending: Surgery | Admitting: Surgery

## 2021-02-07 ENCOUNTER — Encounter (HOSPITAL_COMMUNITY): Payer: Self-pay

## 2021-02-07 ENCOUNTER — Other Ambulatory Visit: Payer: Self-pay

## 2021-02-07 DIAGNOSIS — Z01812 Encounter for preprocedural laboratory examination: Secondary | ICD-10-CM | POA: Diagnosis not present

## 2021-02-07 HISTORY — DX: Other specified health status: Z78.9

## 2021-02-07 LAB — CBC
HCT: 41.9 % (ref 36.0–46.0)
Hemoglobin: 14 g/dL (ref 12.0–15.0)
MCH: 29.8 pg (ref 26.0–34.0)
MCHC: 33.4 g/dL (ref 30.0–36.0)
MCV: 89.1 fL (ref 80.0–100.0)
Platelets: 194 10*3/uL (ref 150–400)
RBC: 4.7 MIL/uL (ref 3.87–5.11)
RDW: 11.9 % (ref 11.5–15.5)
WBC: 4.7 10*3/uL (ref 4.0–10.5)
nRBC: 0 % (ref 0.0–0.2)

## 2021-02-22 ENCOUNTER — Encounter (HOSPITAL_COMMUNITY)
Admission: RE | Disposition: A | Discharge status: Home / Self Care | Payer: Self-pay | Source: Ambulatory Visit | Attending: Surgery

## 2021-02-22 ENCOUNTER — Ambulatory Visit (HOSPITAL_COMMUNITY): Payer: 59 | Admitting: Certified Registered Nurse Anesthetist

## 2021-02-22 ENCOUNTER — Ambulatory Visit (HOSPITAL_COMMUNITY): Payer: 59 | Admitting: Physician Assistant

## 2021-02-22 ENCOUNTER — Ambulatory Visit (HOSPITAL_COMMUNITY)
Admission: RE | Admit: 2021-02-22 | Discharge: 2021-02-22 | Disposition: A | Discharge status: Home / Self Care | Payer: 59 | Source: Ambulatory Visit | Attending: Surgery | Admitting: Surgery

## 2021-02-22 ENCOUNTER — Encounter (HOSPITAL_COMMUNITY): Payer: Self-pay | Admitting: Surgery

## 2021-02-22 DIAGNOSIS — L02215 Cutaneous abscess of perineum: Secondary | ICD-10-CM | POA: Diagnosis not present

## 2021-02-22 DIAGNOSIS — K219 Gastro-esophageal reflux disease without esophagitis: Secondary | ICD-10-CM | POA: Diagnosis not present

## 2021-02-22 DIAGNOSIS — K603 Anal fistula: Secondary | ICD-10-CM | POA: Insufficient documentation

## 2021-02-22 DIAGNOSIS — S31839A Unspecified open wound of anus, initial encounter: Secondary | ICD-10-CM | POA: Diagnosis not present

## 2021-02-22 DIAGNOSIS — Z8639 Personal history of other endocrine, nutritional and metabolic disease: Secondary | ICD-10-CM | POA: Diagnosis not present

## 2021-02-22 DIAGNOSIS — N764 Abscess of vulva: Secondary | ICD-10-CM | POA: Diagnosis not present

## 2021-02-22 HISTORY — PX: PLACEMENT OF SETON: SHX6029

## 2021-02-22 HISTORY — PX: INCISION AND DRAINAGE ABSCESS: SHX5864

## 2021-02-22 HISTORY — PX: RECTAL EXAM UNDER ANESTHESIA: SHX6399

## 2021-02-22 LAB — PREGNANCY, URINE: Preg Test, Ur: NEGATIVE

## 2021-02-22 SURGERY — INCISION AND DRAINAGE, ABSCESS
Anesthesia: General | Site: Rectum

## 2021-02-22 MED ORDER — ACETAMINOPHEN 500 MG PO TABS
1000.0000 mg | ORAL_TABLET | ORAL | Status: AC
  Administered 2021-02-22: 1000 mg via ORAL
  Filled 2021-02-22: qty 2

## 2021-02-22 MED ORDER — BUPIVACAINE LIPOSOME 1.3 % IJ SUSP: 20.0000 mL | Freq: Once | INTRAMUSCULAR | Status: DC

## 2021-02-22 MED ORDER — ORAL CARE MOUTH RINSE: 15.0000 mL | Freq: Once | OROMUCOSAL | Status: AC

## 2021-02-22 MED ORDER — METHYLENE BLUE 0.5 % INJ SOLN
INTRAVENOUS | Status: AC
  Filled 2021-02-22: qty 10

## 2021-02-22 MED ORDER — FENTANYL CITRATE (PF) 100 MCG/2ML IJ SOLN
INTRAMUSCULAR | Status: AC
  Filled 2021-02-22: qty 2

## 2021-02-22 MED ORDER — OXYCODONE HCL 5 MG PO TABS: 5.0000 mg | ORAL_TABLET | Freq: Once | ORAL | Status: DC | PRN

## 2021-02-22 MED ORDER — BUPIVACAINE-EPINEPHRINE (PF) 0.25% -1:200000 IJ SOLN
INTRAMUSCULAR | Status: DC | PRN
  Administered 2021-02-22: 30 mL

## 2021-02-22 MED ORDER — PROPOFOL 10 MG/ML IV BOLUS
INTRAVENOUS | Status: DC | PRN
  Administered 2021-02-22: 150 mg via INTRAVENOUS

## 2021-02-22 MED ORDER — CHLORHEXIDINE GLUCONATE 0.12 % MT SOLN
15.0000 mL | Freq: Once | OROMUCOSAL | Status: AC
  Administered 2021-02-22: 15 mL via OROMUCOSAL

## 2021-02-22 MED ORDER — DEXAMETHASONE SODIUM PHOSPHATE 10 MG/ML IJ SOLN
INTRAMUSCULAR | Status: DC | PRN
  Administered 2021-02-22: 10 mg via INTRAVENOUS

## 2021-02-22 MED ORDER — MIDAZOLAM HCL 2 MG/2ML IJ SOLN
INTRAMUSCULAR | Status: AC
  Filled 2021-02-22: qty 2

## 2021-02-22 MED ORDER — TRAMADOL HCL 50 MG PO TABS: 50.0000 mg | ORAL_TABLET | Freq: Four times a day (QID) | ORAL | 0 refills | Status: AC | PRN

## 2021-02-22 MED ORDER — BUPIVACAINE LIPOSOME 1.3 % IJ SUSP
INTRAMUSCULAR | Status: AC
  Filled 2021-02-22: qty 20

## 2021-02-22 MED ORDER — BUPIVACAINE-EPINEPHRINE (PF) 0.25% -1:200000 IJ SOLN
INTRAMUSCULAR | Status: AC
  Filled 2021-02-22: qty 30

## 2021-02-22 MED ORDER — SUGAMMADEX SODIUM 200 MG/2ML IV SOLN
INTRAVENOUS | Status: DC | PRN
  Administered 2021-02-22: 200 mg via INTRAVENOUS

## 2021-02-22 MED ORDER — PROPOFOL 10 MG/ML IV BOLUS
INTRAVENOUS | Status: AC
  Filled 2021-02-22: qty 20

## 2021-02-22 MED ORDER — KETOROLAC TROMETHAMINE 30 MG/ML IJ SOLN: 30.0000 mg | Freq: Once | INTRAMUSCULAR | Status: DC | PRN

## 2021-02-22 MED ORDER — FENTANYL CITRATE (PF) 100 MCG/2ML IJ SOLN
INTRAMUSCULAR | Status: DC | PRN
  Administered 2021-02-22: 100 ug via INTRAVENOUS
  Administered 2021-02-22 (×2): 50 ug via INTRAVENOUS

## 2021-02-22 MED ORDER — 0.9 % SODIUM CHLORIDE (POUR BTL) OPTIME
TOPICAL | Status: DC | PRN
  Administered 2021-02-22: 1000 mL

## 2021-02-22 MED ORDER — FENTANYL CITRATE PF 50 MCG/ML IJ SOSY: 25.0000 ug | PREFILLED_SYRINGE | INTRAMUSCULAR | Status: DC | PRN

## 2021-02-22 MED ORDER — DEXAMETHASONE SODIUM PHOSPHATE 10 MG/ML IJ SOLN
INTRAMUSCULAR | Status: AC
  Filled 2021-02-22: qty 1

## 2021-02-22 MED ORDER — LIDOCAINE 2% (20 MG/ML) 5 ML SYRINGE
INTRAMUSCULAR | Status: DC | PRN
Start: 2021-02-22 — End: 2021-02-22
  Administered 2021-02-22: 50 mg via INTRAVENOUS

## 2021-02-22 MED ORDER — OXYCODONE HCL 5 MG/5ML PO SOLN: 5.0000 mg | Freq: Once | ORAL | Status: DC | PRN

## 2021-02-22 MED ORDER — BUPIVACAINE LIPOSOME 1.3 % IJ SUSP
INTRAMUSCULAR | Status: DC | PRN
  Administered 2021-02-22: 20 mL

## 2021-02-22 MED ORDER — LACTATED RINGERS IV SOLN: INTRAVENOUS | Status: DC

## 2021-02-22 MED ORDER — PROMETHAZINE HCL 25 MG/ML IJ SOLN: 6.2500 mg | INTRAMUSCULAR | Status: DC | PRN

## 2021-02-22 MED ORDER — ROCURONIUM BROMIDE 10 MG/ML (PF) SYRINGE
PREFILLED_SYRINGE | INTRAVENOUS | Status: DC | PRN
  Administered 2021-02-22: 60 mg via INTRAVENOUS

## 2021-02-22 MED ORDER — MIDAZOLAM HCL 5 MG/5ML IJ SOLN
INTRAMUSCULAR | Status: DC | PRN
Start: 2021-02-22 — End: 2021-02-22
  Administered 2021-02-22: 2 mg via INTRAVENOUS

## 2021-02-22 MED ORDER — ROCURONIUM BROMIDE 10 MG/ML (PF) SYRINGE
PREFILLED_SYRINGE | INTRAVENOUS | Status: AC
  Filled 2021-02-22: qty 20

## 2021-02-22 MED ORDER — ONDANSETRON HCL 4 MG/2ML IJ SOLN
INTRAMUSCULAR | Status: DC | PRN
  Administered 2021-02-22: 4 mg via INTRAVENOUS

## 2021-02-22 MED ORDER — ONDANSETRON HCL 4 MG/2ML IJ SOLN
INTRAMUSCULAR | Status: AC
  Filled 2021-02-22: qty 2

## 2021-02-22 SURGICAL SUPPLY — 24 items
BAG COUNTER SPONGE SURGICOUNT (BAG) IMPLANT
DECANTER SPIKE VIAL GLASS SM (MISCELLANEOUS) ×2 IMPLANT
DRSG PAD ABDOMINAL 8X10 ST (GAUZE/BANDAGES/DRESSINGS) ×2 IMPLANT
ELECT REM PT RETURN 15FT ADLT (MISCELLANEOUS) ×2 IMPLANT
GAUZE SPONGE 4X4 12PLY STRL (GAUZE/BANDAGES/DRESSINGS) IMPLANT
GLOVE SURG ENC MOIS LTX SZ7.5 (GLOVE) ×2 IMPLANT
GLOVE SURG UNDER LTX SZ8 (GLOVE) ×2 IMPLANT
GOWN STRL REUS W/TWL XL LVL3 (GOWN DISPOSABLE) ×4 IMPLANT
KIT BASIN OR (CUSTOM PROCEDURE TRAY) ×2 IMPLANT
NEEDLE HYPO 22GX1.5 SAFETY (NEEDLE) ×2 IMPLANT
PACK GENERAL/GYN (CUSTOM PROCEDURE TRAY) ×2 IMPLANT
PANTS MESH DISP LRG (UNDERPADS AND DIAPERS) ×1 IMPLANT
PANTS MESH DISPOSABLE L (UNDERPADS AND DIAPERS) ×1
SHEARS HARMONIC 9CM CVD (BLADE) IMPLANT
SPONGE SURGIFOAM ABS GEL 100 (HEMOSTASIS) IMPLANT
SURGILUBE 2OZ TUBE FLIPTOP (MISCELLANEOUS) ×2 IMPLANT
SUT CHROMIC 2 0 SH (SUTURE) IMPLANT
SUT CHROMIC 3 0 SH 27 (SUTURE) IMPLANT
SUT MNCRL AB 4-0 PS2 18 (SUTURE) ×2 IMPLANT
SUT VIC AB 3-0 SH 27 (SUTURE) ×1
SUT VIC AB 3-0 SH 27X BRD (SUTURE) ×1 IMPLANT
SYR 20ML LL LF (SYRINGE) ×2 IMPLANT
TOWEL OR 17X26 10 PK STRL BLUE (TOWEL DISPOSABLE) ×2 IMPLANT
TOWEL OR NON WOVEN STRL DISP B (DISPOSABLE) ×2 IMPLANT

## 2021-02-22 NOTE — H&P (Signed)
CC: here today for surgery  HPI: Sabrina Mullins is an 35 y.o. female well-known to [place] who has previously undergone EUA/incision and drainage of right labial abscess 07/26/2020. She was found on imaging to have an abscess adjacent to the vagina and rectum. There is no evident communication with rectum. She subsequently had to separate CTs one on 08/10/20 and another 09/14/20.  The one on 5/12 was to evaluate for some yellowish discharge she has had from the right posterior labial wall.  She had a pelvic MRI completed 09/22/20 to further evaluate things given the negative intraoperative examination by Dr. Freida Busman for any communication with the anal canal. They favored this being a Bartholin's cyst on imaging and there is no evident fistula or communication with the anal canal/rectum.  She was ultimately taken to the operating room by Dr. Pricilla Holm and underwent EUA. She was found to have her labial wound opened and there is no evident fistulization into the anal canal that could be seen at that time.  She underwent repeat pelvic MRI 12/25/2020 with Dr. Pricilla Holm that showed postsurgical changes of a perineal abscess drainage. Short fistulous tract along the inferior and right anterior aspect of the anal sphincter which extends to the gluteal crease but there is no evidence of abscess or communication with the anal sphincter.  She has done okay since. She has not had any pain to the degree when she had an abscess before. She has had ongoing drainage from a wound that abuts her surgical incision and drainage site. This has persisted.  She was initially planning surgery but rescheduled for COVID+. She was seen in urgent office 9/23 with complaint of possible stool draining from small opening in her scar on left anterior perianal region near labia. When examined, had "left posterior labia/anterior perianal region with all pinpoint opening within the scar that drains a small amount of yellow fluid when pressing on the  surrounding areas. There is no overlying erythema, induration, or fluctuance. There is tenderness to palpation, but difficult to pinpoint a possible abscess". Otherwise, denies any changes in her health or health history.    PMH: As above  PSH: As above  FHx: Denies any known family history of colorectal, breast, endometrial or ovarian cancer  Social Hx: Denies use of tobacco/EtOH/illicit drug.   Past Medical History:  Diagnosis Date   Medical history non-contributory    Vulvar abscess 2022   Wears glasses 10/19/2020   Wound infection after surgery 11/01/2020    Past Surgical History:  Procedure Laterality Date   CESAREAN SECTION  02/2016   INCISION AND DRAINAGE ABSCESS N/A 10/25/2020   Procedure: VULVA  ABSCESS TRACT EXCISION;  Surgeon: Carver Fila, MD;  Location: [facility];  Service: Gynecology;  Laterality: N/A;   INCISION AND DRAINAGE PERIRECTAL ABSCESS Right 07/26/2020   Procedure: IRRIGATION AND DEBRIDEMENT right labial ABSCESS;  Surgeon: Fritzi Mandes, MD;  Location: [facility] OR;  Service: General;  Laterality: Right;   VULVAR LESION REMOVAL N/A 11/02/2020   Procedure: Irrigation and Drainage of VULVAR LESION;  Surgeon: Carver Fila, MD;  Location: [facility];  Service: Gynecology;  Laterality: N/A;    Family History  Problem Relation Age of Onset   Healthy Mother    Diabetes Father    Cancer Maternal Grandmother    Breast cancer Maternal Grandmother    Cancer Maternal Grandfather    Lung cancer Maternal Grandfather    Healthy Paternal Grandmother    Heart  disease Paternal Grandmother    Healthy Paternal Grandfather    Colon cancer Neg Hx    Ovarian cancer Neg Hx    Endometrial cancer Neg Hx    Pancreatic cancer Neg Hx    Prostate cancer Neg Hx     Social:  reports that she has never smoked. She has never used smokeless tobacco. She reports that she does not drink alcohol and does not use drugs.  Allergies:  Allergies   Allergen Reactions   Flagyl [Metronidazole] Nausea Only and Other (See Comments)    Dizzy; difficulty concentrating    Medications: I have reviewed the patient's current medications.  No results found for this or any previous visit (from the past 48 hour(s)).  No results found.  ROS - all of the below systems have been reviewed with the patient and positives are indicated with bold text General: chills, fever or night sweats Eyes: blurry vision or double vision ENT: epistaxis or sore throat Allergy/Immunology: itchy/watery eyes or nasal congestion Hematologic/Lymphatic: bleeding problems, blood clots or swollen lymph nodes Endocrine: temperature intolerance or unexpected weight changes Breast: new or changing breast lumps or nipple discharge Resp: cough, shortness of breath, or wheezing CV: chest pain or dyspnea on exertion GI: as per HPI GU: dysuria, trouble voiding, or hematuria MSK: joint pain or joint stiffness Neuro: TIA or stroke symptoms Derm: pruritus and skin lesion changes Psych: anxiety and depression  PE There were no vitals taken for this visit. Constitutional: NAD; conversant Eyes: Moist conjunctiva Lungs: Normal respiratory effort CV: RRR GI: Abd soft,NT/ND; no palpable hepatosplenomegaly MSK: Normal range of motion of extremities; no clubbing/cyanosis Psychiatric: Appropriate affect; alert and oriented x3  No results found for this or any previous visit (from the past 48 hour(s)).  No results found.   A/P: Sabrina Mullins is a very pleasant 35 y.o. female with hx of perineal wound/abscess - no apparent anal fistula on intraop eval/EUA with Dr. Pricilla Holm or apparent on MRI  -Clinically, this does appear as though she may have an underlying anal fistula which has been somewhat occult and difficult to prove. We discussed neck steps moving forward to further identify whether or not this is a fistula being anorectal exam under anesthesia with interrogation of her  perineal wound. If she were to have a fistula, this would likely be Laveda Norman sphincteric and therefore potentially proceeding with placement of a draining seton as a first step. Potential for additional procedures in the months following. -The anatomy of the anal canal was discussed with the patient. The pathophysiology of Anal fistulas was discussed as well -We have reviewed options going forward including further observation vs surgery -anorectal exam under anesthesia with interrogation of perineal wound, possible placement of draining seton versus fistulotomy based on intraoperative findings. -The planned procedure, material risks (including, but not limited to, pain, bleeding, infection, scarring, damage to anal sphincter, incontinence of gas and/or stool, need for additional procedures, recurrence, pneumonia, heart attack, stroke, death) benefits and alternatives to surgery were discussed at length. The patient's questions were answered to her satisfaction, she voiced understanding and elected to proceed with surgery. Additionally, we discussed typical postoperative expectations and the recovery process.  Marin Olp, MD [facility] Use [email or web address] to contact on call provider

## 2021-02-22 NOTE — Op Note (Signed)
02/22/2021  10:06 AM  PATIENT:  Sabrina Mullins  35 y.o. female  Patient Care Team: Lavada Mesi, MD as PCP - General (Family Medicine)  PRE-OPERATIVE DIAGNOSIS:  Perineal wound, possible anal fistula  POST-OPERATIVE DIAGNOSIS:  Right anterior transsphincteric anal fistula  PROCEDURE:   Incision and drainage of perineal abscess Placement of draining seton (blue vessel loop) Anorectal exam under anesthesia  SURGEON:  Surgeon(s): Andria Meuse, MD   ANESTHESIA:   local and general  SPECIMEN:  No Specimen  DISPOSITION OF SPECIMEN:  N/A  COUNTS:  Sponge, needle, and instrument counts were reported correct x2 at conclusion.  EBL: 2 mL  PLAN OF CARE: Discharge to home after PACU  PATIENT DISPOSITION:  PACU - hemodynamically stable.  OR FINDINGS: Normal-appearing anoderm.  Wound with 1 cc containing pocket of purulent abscess fluid on the right posterior most aspect of the labia/vulva.  This wound was opened and drained.  This tract was cannulated with a fistula probe and found to communicate with the right anterior midline anal canal at approximately the level of the dentate line.  This is transsphincteric in nature.  Given the purulence encountered and likely extensions off this tract, the decision was made to proceed with a draining vessel loop seton.  Ultimately would likely be a reasonable candidate for a ligation of the intersphincteric fistulous tract versus endorectal advancement flap  DESCRIPTION: The patient was identified in the preoperative holding area and taken to the OR. SCDs were applied. She then underwent general endotracheal anesthesia without difficulty. The patient was then rolled onto the OR table in the prone jackknife position. Pressure points were then evaluated and padded. Benzoin was applied to the buttocks and they were gently taped apart.  She was then prepped and draped in usual sterile fashion.  A surgical timeout was performed indicating the correct  patient, procedure, and positioning.  A perianal block was then created using a dilute mixture of 0.25% Marcaine with epinephrine and Exparel.  After ascertaining an appropriate level of anesthesia had been achieved, a well lubricated digital rectal exam was performed. This demonstrated no palpable abnormality.  A Hill-Ferguson anoscope was into the anal canal and circumferential inspection demonstrated healthy appearing anoderm without ulceration or significant granulation.  Externally, there is a wound on the posterior most aspect of the right labia/vulva.  This is partially incised to facilitate exploration of the wound.  There is about 1 cc of strawberry milk colored purulent fluid that expresses from the tract.  The tract was then carefully probed taking care not to create false passages.  This was done with an anoscope in place.  There are a few extensions off this tract but the dominant portion of this fistula does communicate with the anal canal.  This is near the anterior midline of the anal canal.  The tract is then palpated and found to contain both sphincter muscles.  This is a transsphincteric anal fistula.  Given the extensions off this and the purulence encountered, the decision was made to proceed with placement of a draining vessel loop seton.  The fistula probe was exchanged for a blue vessel loop.  This is secured to itself with 2-0 silk sutures.  The anal canal was inspected and hemostasis appreciated.  There is no other perineal wounds or erythema to suggest other tracts or problems.  All sponge, needle, instrument counts were reported correct.  She is then rolled back onto a stretcher, awakened from anesthesia, extubated, and transported to the recovery room  in satisfactory condition.  DISPOSITION: PACU in satisfactory condition.

## 2021-02-22 NOTE — Transfer of Care (Signed)
Immediate Anesthesia Transfer of Care Note  Patient: Sabrina Mullins  Procedure(s) Performed: Procedure(s): INCISION AND DRAINAGE OF PERIANAL WOUND (N/A) PLACEMENT OF SETON (N/A) ANORECTAL EXAM UNDER ANESTHESIA (N/A)  Patient Location: PACU  Anesthesia Type:General  Level of Consciousness: Alert, Awake, Oriented  Airway & Oxygen Therapy: Patient Spontanous Breathing  Post-op Assessment: Report given to RN  Post vital signs: Reviewed and stable  Last Vitals:  Vitals:   02/22/21 0843  BP: 119/73  Pulse: 72  Resp: 15  Temp: 36.8 C  SpO2: 100%    Complications: No apparent anesthesia complications

## 2021-02-22 NOTE — Anesthesia Preprocedure Evaluation (Signed)
Anesthesia Evaluation  Patient identified by MRN, date of birth, ID band Patient awake    Reviewed: Allergy & Precautions, NPO status , Patient's Chart, lab work & pertinent test results  Airway Mallampati: II  TM Distance: >3 FB Neck ROM: Full    Dental no notable dental hx.    Pulmonary neg pulmonary ROS,    Pulmonary exam normal breath sounds clear to auscultation       Cardiovascular negative cardio ROS Normal cardiovascular exam Rhythm:Regular Rate:Normal     Neuro/Psych negative neurological ROS  negative psych ROS   GI/Hepatic Neg liver ROS, GERD  Medicated,  Endo/Other  negative endocrine ROS  Renal/GU negative Renal ROS  negative genitourinary   Musculoskeletal negative musculoskeletal ROS (+)   Abdominal   Peds negative pediatric ROS (+)  Hematology negative hematology ROS (+)   Anesthesia Other Findings   Reproductive/Obstetrics negative OB ROS                             Anesthesia Physical Anesthesia Plan  ASA: 2  Anesthesia Plan: General   Post-op Pain Management:    Induction: Intravenous  PONV Risk Score and Plan: 3 and Ondansetron, Dexamethasone and Treatment may vary due to age or medical condition  Airway Management Planned: LMA  Additional Equipment:   Intra-op Plan:   Post-operative Plan: Extubation in OR  Informed Consent: I have reviewed the patients History and Physical, chart, labs and discussed the procedure including the risks, benefits and alternatives for the proposed anesthesia with the patient or authorized representative who has indicated his/her understanding and acceptance.     Dental advisory given  Plan Discussed with: CRNA and Surgeon  Anesthesia Plan Comments:         Anesthesia Quick Evaluation  

## 2021-02-22 NOTE — Discharge Instructions (Addendum)
ANORECTAL SURGERY: POST OP INSTRUCTIONS  DIET: Follow a light bland diet the first 24 hours after arrival home, such as soup, liquids, crackers, etc.  Be sure to include lots of fluids daily.  Avoid fast food or heavy meals as your are more likely to get nauseated.  Eat a low fat diet the next few days after surgery.   Some bleeding with bowel movements is expected for the first couple of days but this should stop in between bowel movements  Take your usually prescribed home medications unless otherwise directed.  PAIN CONTROL: It is helpful to take an over-the-counter pain medication regularly for the first few days/weeks.  Choose from the following that works best for you: Ibuprofen (Advil, etc) Three 200mg  tabs every 6 hours as needed. Acetaminophen (Tylenol, etc) 500-650mg  every 6 hours as needed NOTE: You may take both of these medications together - most patients find it most helpful when alternating between the two (i.e. Ibuprofen at 6am, tylenol at 9am, ibuprofen at 12pm .. ) A  prescription for pain medication may have been prescribed for you at discharge.  Take your pain medication as prescribed.  If you are having problems/concerns with the prescription medicine, please call [facility] for further advice.  Avoid getting constipated.  Between the surgery and the pain medications, it is common to experience some constipation.  Increasing fluid intake (64oz of water per day) and taking a fiber supplement (such as Metamucil, Citrucel, FiberCon) 1-2 times a day regularly will usually help prevent this problem from occurring.  Take Miralax (over the counter) 1-2x/day while taking a narcotic pain medication. If no bowel movement after 48hours, you may additionally take a laxative like a bottle of Milk of Magnesia which can be purchased over the counter. Avoid enemas if possible as these are often painful.   Watch out for diarrhea.  If you have many loose bowel movements, simplify your diet to bland  foods.  Stop any stool softeners and decrease your fiber supplement. If this worsens or does not improve, please call us.  Wash / shower every day.  If you were discharged with a dressing, you may remove this the day after your surgery. You may shower normally, getting soap/water on your wound, particularly after bowel movements.  Soaking in a warm bath filled a couple inches ("Sitz bath") is a great way to clean the area after a bowel movement and many patients find it is a way to soothe the area.  ACTIVITIES as tolerated:   You may resume regular (light) daily activities beginning the next day--such as daily self-care, walking, climbing stairs--gradually increasing activities as tolerated.  If you can walk 30 minutes without difficulty, it is safe to try more intense activity such as jogging, treadmill, bicycling, low-impact aerobics, etc. Refrain from any heavy lifting or straining for the first 2 weeks after your procedure, particularly if your surgery was for hemorrhoids. Avoid activities that make your pain worse You may drive when you are no longer taking prescription pain medication, you can comfortably wear a seatbelt, and you can safely maneuver your car and apply brakes.  FOLLOW UP in our office Please call [facility] at [phone] to set up an appointment to see your surgeon in the office for a follow-up appointment approximately 2 weeks after your surgery. Make sure that you call for this appointment the day you arrive home to insure a convenient appointment time.  If you have disability or family leave forms that need to be completed, you  may have them completed by your primary care physician's office; for return to work instructions, please ask our office staff and they will be happy to assist you in obtaining this documentation   When to call us [phone]: Poor pain control Reactions / problems with new medications (rash/itching, etc)  Fever over 101.5 F (38.5 C) Inability to  urinate Nausea/vomiting Worsening swelling or bruising Continued bleeding from incision. Increased pain, redness, or drainage from the incision  The clinic staff is available to answer your questions during regular business hours (8:30am-5pm).  Please don't hesitate to call and ask to speak to one of our nurses for clinical concerns.   A surgeon from [facility] is always on call at the hospitals   If you have a medical emergency, go to the nearest emergency room or call 911.   [facility] [address], [address], [place], [place]  [address] MAIN: [phone] FAX: [phone] [email or web address]

## 2021-02-22 NOTE — Anesthesia Postprocedure Evaluation (Signed)
Anesthesia Post Note  Patient: Sabrina Mullins  Procedure(s) Performed: INCISION AND DRAINAGE OF PERIANAL WOUND (Rectum) PLACEMENT OF SETON (Rectum) ANORECTAL EXAM UNDER ANESTHESIA (Rectum)     Patient location during evaluation: PACU Anesthesia Type: General Level of consciousness: awake and alert Pain management: pain level controlled Vital Signs Assessment: post-procedure vital signs reviewed and stable Respiratory status: spontaneous breathing, nonlabored ventilation, respiratory function stable and patient connected to nasal cannula oxygen Cardiovascular status: blood pressure returned to baseline and stable Postop Assessment: no apparent nausea or vomiting Anesthetic complications: no   No notable events documented.  Last Vitals:  Vitals:   02/22/21 1030 02/22/21 1045  BP: 112/71 110/71  Pulse: 88 77  Resp: 17 17  Temp:    SpO2: 100% 100%    Last Pain:  Vitals:   02/22/21 1045  TempSrc:   PainSc: 0-No pain                 [name] S

## 2021-02-22 NOTE — Anesthesia Procedure Notes (Signed)
Procedure Name: Intubation Date/Time: 02/22/2021 9:27 AM Performed by: Gerald Leitz, CRNA Pre-anesthesia Checklist: Patient identified, Patient being monitored, Timeout performed, Emergency Drugs available and Suction available Patient Re-evaluated:Patient Re-evaluated prior to induction Oxygen Delivery Method: Circle system utilized Preoxygenation: Pre-oxygenation with 100% oxygen Induction Type: IV induction Ventilation: Mask ventilation without difficulty Laryngoscope Size: Mac and 3 Grade View: Grade I Tube type: Oral Tube size: 7.0 mm Number of attempts: 2 Airway Equipment and Method: Stylet Placement Confirmation: ETT inserted through vocal cords under direct vision, positive ETCO2 and breath sounds checked- equal and bilateral Secured at: 21 cm Tube secured with: Tape Dental Injury: Teeth and Oropharynx as per pre-operative assessment  Difficulty Due To: Difficulty was unanticipated and Difficult Airway- due to anterior larynx

## 2021-02-23 ENCOUNTER — Encounter (HOSPITAL_COMMUNITY): Payer: Self-pay | Admitting: Surgery

## 2021-06-17 DIAGNOSIS — J020 Streptococcal pharyngitis: Secondary | ICD-10-CM

## 2021-06-17 DIAGNOSIS — H109 Unspecified conjunctivitis: Secondary | ICD-10-CM

## 2021-06-17 HISTORY — DX: Streptococcal pharyngitis: J02.0

## 2021-06-17 HISTORY — DX: Unspecified conjunctivitis: H10.9

## 2021-06-26 ENCOUNTER — Other Ambulatory Visit: Payer: Self-pay

## 2021-06-26 ENCOUNTER — Encounter (HOSPITAL_BASED_OUTPATIENT_CLINIC_OR_DEPARTMENT_OTHER): Payer: Self-pay | Admitting: Surgery

## 2021-06-26 NOTE — Progress Notes (Signed)
Spoke w/ via phone for pre-op interview---pt Lab needs dos---- urine poct              Lab results------none COVID test -----patient states asymptomatic no test needed Arrive at -------1230 pm 06-29-2021 NPO after MN NO Solid Food.  Clear liquids from MN until---1130 am Med rec completed Medications to take morning of surgery -----none Diabetic medication -----n/a Patient instructed no nail polish to be worn day of surgery Patient instructed to bring photo id and insurance card day of surgery Patient aware to have Driver (ride ) / caregiver    spouse Sabrina Mullins cell [phone] will drop pt off for 24 hours after surgery  Patient Special Instructions ---- surgery orders req epic ib Pre-Op special Istructions -----none Patient verbalized understanding of instructions that were given at this phone interview. Patient denies shortness of breath, chest pain, fever, cough at this phone interview.

## 2021-06-28 ENCOUNTER — Ambulatory Visit: Payer: Self-pay | Admitting: Surgery

## 2021-06-28 NOTE — Progress Notes (Signed)
Pt notified of arrival time change from 1230 to 1030 and clear liquids until 0930 and verbalized understanding.

## 2021-06-29 ENCOUNTER — Ambulatory Visit (HOSPITAL_BASED_OUTPATIENT_CLINIC_OR_DEPARTMENT_OTHER): Payer: 59 | Admitting: Certified Registered Nurse Anesthetist

## 2021-06-29 ENCOUNTER — Other Ambulatory Visit: Payer: Self-pay

## 2021-06-29 ENCOUNTER — Encounter (HOSPITAL_BASED_OUTPATIENT_CLINIC_OR_DEPARTMENT_OTHER)
Admission: RE | Disposition: A | Discharge status: Home / Self Care | Payer: Self-pay | Source: Home / Self Care | Attending: Surgery

## 2021-06-29 ENCOUNTER — Ambulatory Visit (HOSPITAL_BASED_OUTPATIENT_CLINIC_OR_DEPARTMENT_OTHER)
Admission: RE | Admit: 2021-06-29 | Discharge: 2021-06-29 | Disposition: A | Discharge status: Home / Self Care | Payer: 59 | Attending: Surgery | Admitting: Surgery

## 2021-06-29 ENCOUNTER — Encounter (HOSPITAL_BASED_OUTPATIENT_CLINIC_OR_DEPARTMENT_OTHER): Payer: Self-pay | Admitting: Surgery

## 2021-06-29 DIAGNOSIS — K603 Anal fistula: Secondary | ICD-10-CM | POA: Insufficient documentation

## 2021-06-29 HISTORY — PX: LIGATION OF INTERNAL FISTULA TRACT: SHX6551

## 2021-06-29 LAB — POCT PREGNANCY, URINE: Preg Test, Ur: NEGATIVE

## 2021-06-29 SURGERY — LIGATION, INTERNAL FISTULA TRACT
Anesthesia: General | Site: Rectum

## 2021-06-29 MED ORDER — PROPOFOL 10 MG/ML IV BOLUS
INTRAVENOUS | Status: AC
  Filled 2021-06-29: qty 20

## 2021-06-29 MED ORDER — FENTANYL CITRATE (PF) 100 MCG/2ML IJ SOLN: 25.0000 ug | INTRAMUSCULAR | Status: DC | PRN

## 2021-06-29 MED ORDER — CHLORHEXIDINE GLUCONATE CLOTH 2 % EX PADS: 6.0000 | MEDICATED_PAD | Freq: Once | CUTANEOUS | Status: DC

## 2021-06-29 MED ORDER — ROCURONIUM BROMIDE 10 MG/ML (PF) SYRINGE
PREFILLED_SYRINGE | INTRAVENOUS | Status: DC | PRN
Start: 2021-06-29 — End: 2021-06-29
  Administered 2021-06-29: 60 mg via INTRAVENOUS

## 2021-06-29 MED ORDER — ONDANSETRON HCL 4 MG/2ML IJ SOLN: 4.0000 mg | Freq: Once | INTRAMUSCULAR | Status: DC | PRN

## 2021-06-29 MED ORDER — FENTANYL CITRATE (PF) 250 MCG/5ML IJ SOLN
INTRAMUSCULAR | Status: DC | PRN
  Administered 2021-06-29: 100 ug via INTRAVENOUS
  Administered 2021-06-29: 50 ug via INTRAVENOUS

## 2021-06-29 MED ORDER — ACETAMINOPHEN 500 MG PO TABS
ORAL_TABLET | ORAL | Status: AC
  Filled 2021-06-29: qty 2

## 2021-06-29 MED ORDER — TRAMADOL HCL 50 MG PO TABS: 50.0000 mg | ORAL_TABLET | Freq: Four times a day (QID) | ORAL | 0 refills | Status: AC | PRN

## 2021-06-29 MED ORDER — ONDANSETRON HCL 4 MG/2ML IJ SOLN
INTRAMUSCULAR | Status: AC
  Filled 2021-06-29: qty 2

## 2021-06-29 MED ORDER — PROPOFOL 10 MG/ML IV BOLUS
INTRAVENOUS | Status: DC | PRN
  Administered 2021-06-29: 140 mg via INTRAVENOUS

## 2021-06-29 MED ORDER — LACTATED RINGERS IV SOLN
INTRAVENOUS | Status: DC
Start: 2021-06-29 — End: 2021-06-29

## 2021-06-29 MED ORDER — PHENYLEPHRINE 40 MCG/ML (10ML) SYRINGE FOR IV PUSH (FOR BLOOD PRESSURE SUPPORT)
PREFILLED_SYRINGE | INTRAVENOUS | Status: AC
  Filled 2021-06-29: qty 10

## 2021-06-29 MED ORDER — SODIUM CHLORIDE 0.9 % IV SOLN
2.0000 g | INTRAVENOUS | Status: AC
  Administered 2021-06-29: 2 g via INTRAVENOUS

## 2021-06-29 MED ORDER — SODIUM CHLORIDE 0.9 % IV SOLN
INTRAVENOUS | Status: AC
  Filled 2021-06-29: qty 2

## 2021-06-29 MED ORDER — BUPIVACAINE LIPOSOME 1.3 % IJ SUSP: 20.0000 mL | Freq: Once | INTRAMUSCULAR | Status: DC

## 2021-06-29 MED ORDER — OXYCODONE HCL 5 MG/5ML PO SOLN: 5.0000 mg | Freq: Once | ORAL | Status: DC | PRN

## 2021-06-29 MED ORDER — BUPIVACAINE LIPOSOME 1.3 % IJ SUSP
INTRAMUSCULAR | Status: DC | PRN
  Administered 2021-06-29: 16 mL

## 2021-06-29 MED ORDER — SUGAMMADEX SODIUM 200 MG/2ML IV SOLN
INTRAVENOUS | Status: DC | PRN
  Administered 2021-06-29: 150 mg via INTRAVENOUS

## 2021-06-29 MED ORDER — ROCURONIUM BROMIDE 10 MG/ML (PF) SYRINGE
PREFILLED_SYRINGE | INTRAVENOUS | Status: AC
  Filled 2021-06-29: qty 10

## 2021-06-29 MED ORDER — MIDAZOLAM HCL 2 MG/2ML IJ SOLN
INTRAMUSCULAR | Status: DC | PRN
Start: 2021-06-29 — End: 2021-06-29
  Administered 2021-06-29: 2 mg via INTRAVENOUS

## 2021-06-29 MED ORDER — LIDOCAINE 2% (20 MG/ML) 5 ML SYRINGE
INTRAMUSCULAR | Status: DC | PRN
  Administered 2021-06-29: 60 mg via INTRAVENOUS

## 2021-06-29 MED ORDER — ACETAMINOPHEN 500 MG PO TABS
1000.0000 mg | ORAL_TABLET | ORAL | Status: AC
  Administered 2021-06-29: 1000 mg via ORAL

## 2021-06-29 MED ORDER — DEXAMETHASONE SODIUM PHOSPHATE 10 MG/ML IJ SOLN
INTRAMUSCULAR | Status: DC | PRN
  Administered 2021-06-29: 10 mg via INTRAVENOUS

## 2021-06-29 MED ORDER — ONDANSETRON HCL 4 MG/2ML IJ SOLN
INTRAMUSCULAR | Status: DC | PRN
Start: 2021-06-29 — End: 2021-06-29
  Administered 2021-06-29: 4 mg via INTRAVENOUS

## 2021-06-29 MED ORDER — FENTANYL CITRATE (PF) 250 MCG/5ML IJ SOLN
INTRAMUSCULAR | Status: AC
  Filled 2021-06-29: qty 5

## 2021-06-29 MED ORDER — OXYCODONE HCL 5 MG PO TABS: 5.0000 mg | ORAL_TABLET | Freq: Once | ORAL | Status: DC | PRN

## 2021-06-29 MED ORDER — BUPIVACAINE-EPINEPHRINE 0.25% -1:200000 IJ SOLN
INTRAMUSCULAR | Status: DC | PRN
  Administered 2021-06-29: 24 mL

## 2021-06-29 MED ORDER — LIDOCAINE HCL (PF) 2 % IJ SOLN
INTRAMUSCULAR | Status: AC
  Filled 2021-06-29: qty 5

## 2021-06-29 MED ORDER — MIDAZOLAM HCL 2 MG/2ML IJ SOLN
INTRAMUSCULAR | Status: AC
  Filled 2021-06-29: qty 2

## 2021-06-29 MED ORDER — PHENYLEPHRINE 40 MCG/ML (10ML) SYRINGE FOR IV PUSH (FOR BLOOD PRESSURE SUPPORT)
PREFILLED_SYRINGE | INTRAVENOUS | Status: DC | PRN
  Administered 2021-06-29: 80 ug via INTRAVENOUS
  Administered 2021-06-29: 40 ug via INTRAVENOUS

## 2021-06-29 SURGICAL SUPPLY — 67 items
APL SKNCLS STERI-STRIP NONHPOA (GAUZE/BANDAGES/DRESSINGS) ×2
BENZOIN TINCTURE PRP APPL 2/3 (GAUZE/BANDAGES/DRESSINGS) ×4 IMPLANT
BLADE EXTENDED COATED 6.5IN (ELECTRODE) IMPLANT
BLADE SURG 10 STRL SS (BLADE) IMPLANT
BLADE SURG 15 STRL LF DISP TIS (BLADE) ×1 IMPLANT
BLADE SURG 15 STRL SS (BLADE) ×2
COVER BACK TABLE 60X90IN (DRAPES) ×2 IMPLANT
COVER MAYO STAND STRL (DRAPES) ×2 IMPLANT
DECANTER SPIKE VIAL GLASS SM (MISCELLANEOUS) ×1 IMPLANT
DRAPE LAPAROTOMY 100X72 PEDS (DRAPES) ×2 IMPLANT
DRAPE UTILITY XL STRL (DRAPES) ×2 IMPLANT
DRSG PAD ABDOMINAL 8X10 ST (GAUZE/BANDAGES/DRESSINGS) ×2 IMPLANT
ELECT NDL BLADE 2-5/6 (NEEDLE) IMPLANT
ELECT NEEDLE BLADE 2-5/6 (NEEDLE) ×2 IMPLANT
GAUZE 4X4 16PLY ~~LOC~~+RFID DBL (SPONGE) ×2 IMPLANT
GAUZE SPONGE 4X4 12PLY STRL (GAUZE/BANDAGES/DRESSINGS) ×2 IMPLANT
GAUZE SPONGE 4X4 12PLY STRL LF (GAUZE/BANDAGES/DRESSINGS) ×1 IMPLANT
GLOVE SRG 8 PF TXTR STRL LF DI (GLOVE) ×1 IMPLANT
GLOVE SURG ENC MOIS LTX SZ7.5 (GLOVE) ×2 IMPLANT
GLOVE SURG UNDER POLY LF SZ8 (GLOVE) ×2
GOWN STRL REUS W/TWL LRG LVL3 (GOWN DISPOSABLE) ×2 IMPLANT
HYDROGEN PEROXIDE 16OZ (MISCELLANEOUS) IMPLANT
IV CATH 14GX2 1/4 (CATHETERS) IMPLANT
IV CATH 18G SAFETY (IV SOLUTION) ×1 IMPLANT
KIT SIGMOIDOSCOPE (SET/KITS/TRAYS/PACK) IMPLANT
KIT TURNOVER CYSTO (KITS) ×2 IMPLANT
LOOP VESSEL MAXI BLUE (MISCELLANEOUS) IMPLANT
NDL SAFETY ECLIPSE 18X1.5 (NEEDLE) IMPLANT
NEEDLE HYPO 18GX1.5 SHARP (NEEDLE)
NEEDLE HYPO 22GX1.5 SAFETY (NEEDLE) ×2 IMPLANT
NS IRRIG 500ML POUR BTL (IV SOLUTION) ×2 IMPLANT
PACK BASIN DAY SURGERY FS (CUSTOM PROCEDURE TRAY) ×2 IMPLANT
PANTS MESH DISP LRG (UNDERPADS AND DIAPERS) ×1 IMPLANT
PANTS MESH DISPOSABLE L (UNDERPADS AND DIAPERS) ×1
PENCIL SMOKE EVACUATOR (MISCELLANEOUS) ×2 IMPLANT
RETRACTOR RING URO 16.6X16.6 (MISCELLANEOUS) ×2 IMPLANT
RETRACTOR STAY HOOK 5MM (MISCELLANEOUS) ×2 IMPLANT
SPONGE HEMORRHOID 8X3CM (HEMOSTASIS) IMPLANT
SPONGE SURGIFOAM ABS GEL 12-7 (HEMOSTASIS) IMPLANT
SUCTION FRAZIER HANDLE 10FR (MISCELLANEOUS) ×1
SUCTION TUBE FRAZIER 10FR DISP (MISCELLANEOUS) ×1 IMPLANT
SUT CHROMIC 2 0 SH (SUTURE) IMPLANT
SUT CHROMIC 3 0 SH 27 (SUTURE) IMPLANT
SUT CHROMIC 4 0 PS 2 18 (SUTURE) ×1 IMPLANT
SUT MNCRL AB 4-0 PS2 18 (SUTURE) IMPLANT
SUT SILK 0 TIES 10X30 (SUTURE) IMPLANT
SUT SILK 2 0 (SUTURE)
SUT SILK 2-0 18XBRD TIE 12 (SUTURE) IMPLANT
SUT VIC AB 2-0 SH 27 (SUTURE)
SUT VIC AB 2-0 SH 27XBRD (SUTURE) IMPLANT
SUT VIC AB 2-0 UR5 27 (SUTURE) ×1 IMPLANT
SUT VIC AB 3-0 SH 18 (SUTURE) IMPLANT
SUT VIC AB 3-0 SH 27 (SUTURE)
SUT VIC AB 3-0 SH 27X BRD (SUTURE) IMPLANT
SUT VIC AB 3-0 SH 27XBRD (SUTURE) IMPLANT
SUT VIC AB 4-0 P-3 18XBRD (SUTURE) IMPLANT
SUT VIC AB 4-0 P3 18 (SUTURE)
SUT VICRYL 0 UR6 27IN ABS (SUTURE) IMPLANT
SUT VICRYL 2 0 18  UND BR (SUTURE)
SUT VICRYL 2 0 18 UND BR (SUTURE) IMPLANT
SYR 20ML LL LF (SYRINGE) IMPLANT
SYR BULB IRRIG 60ML STRL (SYRINGE) ×2 IMPLANT
SYR CONTROL 10ML LL (SYRINGE) ×2 IMPLANT
TOWEL OR 17X26 10 PK STRL BLUE (TOWEL DISPOSABLE) ×2 IMPLANT
TRAY DSU PREP LF (CUSTOM PROCEDURE TRAY) ×2 IMPLANT
TUBE CONNECTING 12X1/4 (SUCTIONS) ×2 IMPLANT
YANKAUER SUCT BULB TIP NO VENT (SUCTIONS) IMPLANT

## 2021-06-29 NOTE — Anesthesia Preprocedure Evaluation (Addendum)
Anesthesia Evaluation  Patient identified by MRN, date of birth, ID band Patient awake    Reviewed: Allergy & Precautions, NPO status , Patient's Chart, lab work & pertinent test results  Airway Mallampati: II  TM Distance: >3 FB Neck ROM: Full    Dental no notable dental hx.    Pulmonary neg pulmonary ROS,    Pulmonary exam normal breath sounds clear to auscultation       Cardiovascular negative cardio ROS Normal cardiovascular exam Rhythm:Regular Rate:Normal     Neuro/Psych negative neurological ROS  negative psych ROS   GI/Hepatic negative GI ROS, Neg liver ROS,   Endo/Other  negative endocrine ROS  Renal/GU negative Renal ROS  negative genitourinary   Musculoskeletal negative musculoskeletal ROS (+)   Abdominal   Peds negative pediatric ROS (+)  Hematology negative hematology ROS (+)   Anesthesia Other Findings   Reproductive/Obstetrics negative OB ROS                             Anesthesia Physical Anesthesia Plan  ASA: 1  Anesthesia Plan: General   Post-op Pain Management: Minimal or no pain anticipated   Induction: Intravenous  PONV Risk Score and Plan: 3 and Ondansetron, Dexamethasone, Midazolam and Treatment may vary due to age or medical condition  Airway Management Planned: Oral ETT  Additional Equipment:   Intra-op Plan:   Post-operative Plan: Extubation in OR  Informed Consent: I have reviewed the patients History and Physical, chart, labs and discussed the procedure including the risks, benefits and alternatives for the proposed anesthesia with the patient or authorized representative who has indicated his/her understanding and acceptance.     Dental advisory given  Plan Discussed with: CRNA and Surgeon  Anesthesia Plan Comments:        Anesthesia Quick Evaluation

## 2021-06-29 NOTE — Anesthesia Postprocedure Evaluation (Signed)
Anesthesia Post Note  Patient: Sabrina Mullins  Procedure(s) Performed: LIGATION OF INTERSPHINCTERIC FISTULOUS TRACT VS. FISTULOTOMY (Rectum) ANORECTAL EXAM UNDER ANESTHESIA (Rectum)     Patient location during evaluation: PACU Anesthesia Type: General Level of consciousness: awake and alert Pain management: pain level controlled Vital Signs Assessment: post-procedure vital signs reviewed and stable Respiratory status: spontaneous breathing, nonlabored ventilation, respiratory function stable and patient connected to nasal cannula oxygen Cardiovascular status: blood pressure returned to baseline and stable Postop Assessment: no apparent nausea or vomiting Anesthetic complications: no   No notable events documented.  Last Vitals:  Vitals:   06/29/21 1335 06/29/21 1345  BP: 107/70   Pulse: 85 86  Resp: 14 16  Temp: 36.6 C   SpO2: 100% 97%    Last Pain:  Vitals:   06/29/21 1335  TempSrc:   PainSc: 0-No pain                 [name] S

## 2021-06-29 NOTE — Op Note (Addendum)
06/29/2021  1:34 PM  PATIENT:  Sabrina Mullins  36 y.o. female  Patient Care Team: Pcp, No as PCP - General  PRE-OPERATIVE DIAGNOSIS:  Transsphincteric anal fistula  POST-OPERATIVE DIAGNOSIS:  Same  PROCEDURE:   Ligation of intersphincteric fistulous tract (LIFT) Anorectal exam under anesthesia  SURGEON:  Surgeon(s): Ileana Roup, MD  ASSISTANT: OR staff   ANESTHESIA:   local and general  SPECIMEN:  No Specimen  DISPOSITION OF SPECIMEN:  N/A  COUNTS:  Sponge, needle, and instrument counts were reported correct x2 at conclusion.  EBL: 10 mL  Drains: None  PLAN OF CARE: Discharge to home after PACU  PATIENT DISPOSITION:  PACU - hemodynamically stable.  OR FINDINGS: Transsphincteric anal fistula - anteriorly based - external opening left posterior introitus/perineum. Internal opening at/just off midline right anterior at the dentate.   DESCRIPTION: The patient was identified in the preoperative holding area and taken to the OR. SCDs were applied. She then underwent general endotracheal anesthesia without difficulty. The patient was then rolled onto the OR table in the prone jackknife position. Pressure points were then evaluated and padded. Benzoin was applied to the buttocks and they were gently taped apart.  She was then prepped and draped in usual sterile fashion.  A surgical timeout was performed indicating the correct patient, procedure, and positioning.  A perianal block was then created using a dilute mixture of 0.25% Marcaine with epinephrine and Exparel.  After ascertaining an appropriate level of anesthesia had been achieved, a well lubricated digital rectal exam was performed. This demonstrated no palpable masses. Seton and fistula tract palpable. Scarring consistent with prior surgery lateral to the perineum.  A Hill-Ferguson anoscope was into the anal canal and circumferential inspection demonstrated healthy/normal-appearing anoderm without ulceration or  granulation tissue.  No fissures.  Internal opening of the fistula emanates just off midline in the anterior position.  There is no evidence of any sort of perianal Crohn's disease.  External opening is in the perineum/just at the opening of the introitus on the patient's right side.  The tract is palpable relatively deep and appears to be persistently transsphincteric in nature. The fistula is gently curetted to remove hypergranulation tissue.  The intersphincteric groove is palpated laterally.  There is significant scarring overlying the actual fistula tract.  Continuing a plane around the anal os, the intersphincteric groove is identified.  The skin is incised.  Using needle tip Bovie, the intersphincteric groove is carefully dissected.  A [place] retractor is placed. The seton is exchanged for a fistula probe.  The fistula tract is then approached maintaining this plane between the external/internal sphincter muscle.  No sphincter muscle was divided during this dissection.  There is scarring overlying the fistula tract and her fistula is not well-defined but we are able to circumferentially dissect it.  After circumferentially dissecting this, the fistula probe was removed and the fistula is clamped on both sides.  The fistula is divided between the clamps.  Each respective side is then suture-ligated using 2-0 Vicryl figure-of-eight suture.  Additional sutures were then placed for further reinforcement.  The internal opening is then gently probed with a crypt hook and there is no evident open fistula from the anal canal.  The external opening is then carefully probed as well and there is no evident opening within the intersphincteric plane.  The space is then irrigated and hemostasis verified.  The wound is then closed in layers using 3-0 Vicryl suture to reapproximate the intersphincteric plane. All  sponge, needle and instrument counts are reported correct.  The skin of the intersphincteric groove was then  approximated using a 4-0 Monocryl suture.  Additional 4-0 chromic sutures are placed for further reinforcement. Additional local anesthetic is infiltrated.  Topical Dibucaine is applied. Buttock tape is released. A dressing consisting of 4 x 4's, ABD, and mesh underwear was placed.  DISPOSITION: PACU in satisfactory condition.

## 2021-06-29 NOTE — Transfer of Care (Signed)
Immediate Anesthesia Transfer of Care Note  Patient: Sabrina Mullins  Procedure(s) Performed: LIGATION OF INTERSPHINCTERIC FISTULOUS TRACT VS. FISTULOTOMY (Rectum) ANORECTAL EXAM UNDER ANESTHESIA (Rectum)  Patient Location: PACU  Anesthesia Type:General  Level of Consciousness: awake, drowsy and responds to stimulation  Airway & Oxygen Therapy: Patient Spontanous Breathing and Patient connected to face mask oxygen  Post-op Assessment: Report given to RN and Post -op Vital signs reviewed and stable  Post vital signs: Reviewed and stable  Last Vitals:  Vitals Value Taken Time  BP 107/70 06/29/21 1337  Temp    Pulse 87 06/29/21 1338  Resp 15 06/29/21 1338  SpO2 100 % 06/29/21 1338  Vitals shown include unvalidated device data.  Last Pain:  Vitals:   06/29/21 1036  TempSrc: Oral  PainSc: 3       Patients Stated Pain Goal: 5 (06/29/21 1036)  Complications: No notable events documented.

## 2021-06-29 NOTE — Anesthesia Procedure Notes (Signed)
Procedure Name: Intubation Date/Time: 06/29/2021 12:04 PM Performed by: Lollie Sails, CRNA Pre-anesthesia Checklist: Patient identified, Emergency Drugs available, Suction available and Patient being monitored Patient Re-evaluated:Patient Re-evaluated prior to induction Oxygen Delivery Method: Circle System Utilized Preoxygenation: Pre-oxygenation with 100% oxygen Induction Type: IV induction Ventilation: Mask ventilation without difficulty Laryngoscope Size: Miller and 3 Grade View: Grade I Tube type: Oral Number of attempts: 1 Airway Equipment and Method: Stylet Placement Confirmation: ETT inserted through vocal cords under direct vision, positive ETCO2 and breath sounds checked- equal and bilateral Secured at: 22 cm Tube secured with: Tape Dental Injury: Teeth and Oropharynx as per pre-operative assessment

## 2021-06-29 NOTE — H&P (Signed)
CC: Anal fistula, here for surgery  HPI: Sabrina Mullins is an 36 y.o. female well-known to [place] who has previously undergone EUA/incision and drainage of right labial abscess 07/26/2020. She was found on imaging to have an abscess adjacent to the vagina and rectum. There is no evident communication with rectum. She subsequently had to separate CTs one on 08/10/20 and another 09/14/20.  The one on 5/12 was to evaluate for some yellowish discharge she has had from the right posterior labial wall.  She had a pelvic MRI completed 09/22/20 to further evaluate things given the negative intraoperative examination by Dr. Zenia Resides for any communication with the anal canal. They favored this being a Bartholin's cyst on imaging and there is no evident fistula or communication with the anal canal/rectum.  She was ultimately taken to the operating room by Dr. Berline Lopes and underwent EUA. She was found to have her labial wound opened and there is no evident fistulization into the anal canal that could be seen at that time.  She underwent repeat pelvic MRI 12/25/2020 with Dr. Berline Lopes that showed postsurgical changes of a perineal abscess drainage. Short fistulous tract along the inferior and right anterior aspect of the anal sphincter which extends to the gluteal crease but there is no evidence of abscess or communication with the anal sphincter.  She has done okay since. She has not had any pain to the degree when she had an abscess before. She has had ongoing drainage from a wound that abuts her surgical incision and drainage site. This has persisted.  OR 02/22/21 -incision and drainage of perineal abscess, placement of draining seton, anorectal exam under anesthesia.  Findings: Normal-appearing anoderm with a 1 cc pus containing pocket on the right posterior aspect of the labia. This was opened and drained. Tract found to communicate as a fistula which is Rona Ravens sphincteric through the right anterior midline anal canal to the  level of the dentate line. A draining seton was placed.  INTERVAL HX Denies any changes in her health or health hx since we met back in the office 03/19/21. She has been doing well since that visit. The seton has remained in place. She denies any recurrent pain that was anything like what she had when she had abscesses before. No fever or chills. Scant drainage. She denies any issues with incontinence to gas/liquid/solid stool.  PMH: As above  PSH: As above  FHx: Denies any known family history of colorectal, breast, endometrial or ovarian cancer  Social Hx: Denies use of tobacco/EtOH/illicit drug. She has dual Glass blower/designer  Past Medical History:  Diagnosis Date   Conjunctivitis right eye 06/17/2021   polytrim x 7 days all symptoms resolved   Strep throat 06/17/2021   amoxicillin x 7 days all symptoms resolved   Vulvar abscess 2022   Wears glasses 10/19/2020   Wound infection after surgery 11/01/2020    Past Surgical History:  Procedure Laterality Date   CESAREAN SECTION  02/2016   INCISION AND DRAINAGE ABSCESS N/A 10/25/2020   Procedure: VULVA  ABSCESS TRACT EXCISION;  Surgeon: Lafonda Mosses, MD;  Location: [facility];  Service: Gynecology;  Laterality: N/A;   INCISION AND DRAINAGE ABSCESS N/A 02/22/2021   Procedure: INCISION AND DRAINAGE OF PERIANAL WOUND;  Surgeon: Ileana Roup, MD;  Location: [facility];  Service: General;  Laterality: N/A;   INCISION AND DRAINAGE PERIRECTAL ABSCESS Right 07/26/2020   Procedure: IRRIGATION AND DEBRIDEMENT right labial ABSCESS;  Surgeon: Dwan Bolt, MD;  Location: [facility];  Service: General;  Laterality: Right;   PLACEMENT OF SETON N/A 02/22/2021   Procedure: PLACEMENT OF SETON;  Surgeon: Ileana Roup, MD;  Location: [facility];  Service: General;  Laterality: N/A;   RECTAL EXAM UNDER ANESTHESIA N/A 02/22/2021   Procedure: ANORECTAL EXAM UNDER ANESTHESIA;  Surgeon: Ileana Roup, MD;  Location:  [facility];  Service: General;  Laterality: N/A;   VULVAR LESION REMOVAL N/A 11/02/2020   Procedure: Irrigation and Drainage of VULVAR LESION;  Surgeon: Lafonda Mosses, MD;  Location: [facility];  Service: Gynecology;  Laterality: N/A;    Family History  Problem Relation Age of Onset   Healthy Mother    Diabetes Father    Cancer Maternal Grandmother    Breast cancer Maternal Grandmother    Cancer Maternal Grandfather    Lung cancer Maternal Grandfather    Healthy Paternal Grandmother    Heart disease Paternal Grandmother    Healthy Paternal Grandfather    Colon cancer Neg Hx    Ovarian cancer Neg Hx    Endometrial cancer Neg Hx    Pancreatic cancer Neg Hx    Prostate cancer Neg Hx     Social:  reports that she has never smoked. She has never used smokeless tobacco. She reports that she does not drink alcohol and does not use drugs.  Allergies:  Allergies  Allergen Reactions   Flagyl [Metronidazole] Nausea Only and Other (See Comments)    Dizzy; difficulty concentrating    Medications: I have reviewed the patient's current medications.  Results for orders placed or performed during the hospital encounter of 06/29/21 (from the past 48 hour(s))  Pregnancy, urine POC     Status: None   Collection Time: 06/29/21 10:22 AM  Result Value Ref Range   Preg Test, Ur NEGATIVE NEGATIVE    Comment:        THE SENSITIVITY OF THIS METHODOLOGY IS >24 mIU/mL     No results found.  ROS - all of the below systems have been reviewed with the patient and positives are indicated with bold text General: chills, fever or night sweats Eyes: blurry vision or double vision ENT: epistaxis or sore throat Allergy/Immunology: itchy/watery eyes or nasal congestion Hematologic/Lymphatic: bleeding problems, blood clots or swollen lymph nodes Endocrine: temperature intolerance or unexpected weight changes Breast: new or changing breast lumps or nipple discharge Resp: cough,  shortness of breath, or wheezing CV: chest pain or dyspnea on exertion GI: as per HPI GU: dysuria, trouble voiding, or hematuria MSK: joint pain or joint stiffness Neuro: TIA or stroke symptoms Derm: pruritus and skin lesion changes Psych: anxiety and depression  PE Blood pressure 99/66, pulse 72, temperature 97.8 F (36.6 C), temperature source Oral, resp. rate 15, height '5\' 5"'  (1.651 m), weight 62.6 kg, last menstrual period 06/21/2021, SpO2 99 %. Constitutional: NAD; conversant; wearing mask Eyes: Moist conjunctiva; no lid lag; anicteric Lungs: Normal respiratory effort; no tactile fremitus CV: RRR Psychiatric: Appropriate affect; alert and oriented x3  Results for orders placed or performed during the hospital encounter of 06/29/21 (from the past 48 hour(s))  Pregnancy, urine POC     Status: None   Collection Time: 06/29/21 10:22 AM  Result Value Ref Range   Preg Test, Ur NEGATIVE NEGATIVE    Comment:        THE SENSITIVITY OF THIS METHODOLOGY IS >24 mIU/mL     No results found.   A/P: Sabrina Mullins is an 36 y.o.  female with hx of perineal wound/abscess - found to have transsphincteric anteriorly based anal fistula - now with indwelling draining seton - 02/22/21  -She remains motivated to move forward with surgery. We have discussed anorectal exam under anesthesia, ligation of intersphincteric tract (LIFT) vs fistulotomy based on intraoperative findings, appearance etc.  -The planned procedure, material risks (including, but not limited to, pain, bleeding, infection, scarring, damage to anal sphincter, incontinence of gas and/or stool, need for additional procedures, recurrence rates of ~20% following LIFT, pneumonia, heart attack, stroke, death) benefits and alternatives to surgery were discussed at length. The patient's questions were answered to her satisfaction, she voiced understanding and elected to proceed with surgery. Additionally, we discussed typical postoperative  expectations and the recovery process.  Nadeen Landau, MD [facility], [facility]

## 2021-06-29 NOTE — Discharge Instructions (Addendum)
ANORECTAL SURGERY: POST OP INSTRUCTIONS  DIET: Follow a light bland diet the first 24 hours after arrival home, such as soup, liquids, crackers, etc.  Be sure to include lots of fluids daily.  Avoid fast food or heavy meals as your are more likely to get nauseated.  Eat a low fat diet the next few days after surgery.   Some bleeding with bowel movements is expected for the first couple of days but this should stop in between bowel movements  Take your usually prescribed home medications unless otherwise directed.  PAIN CONTROL: It is helpful to take an over-the-counter pain medication regularly for the first few days/weeks.  Choose from the following that works best for you: Ibuprofen (Advil, etc) Three 200mg  tabs every 6 hours as needed. Acetaminophen (Tylenol, etc) 500-650mg  every 6 hours as needed NOTE: You may take both of these medications together - most patients find it most helpful when alternating between the two (i.e. Ibuprofen at 6am, tylenol at 9am, ibuprofen at 12pm ..Marland Kitchen) A  prescription for pain medication may have been prescribed for you at discharge.  Take your pain medication as prescribed.  If you are having problems/concerns with the prescription medicine, please call us for further advice.  Avoid getting constipated.  Between the surgery and the pain medications, it is common to experience some constipation.  Increasing fluid intake (64oz of water per day) and taking a fiber supplement (such as Metamucil, Citrucel, FiberCon) 1-2 times a day regularly will usually help prevent this problem from occurring.  Take Miralax (over the counter) 1-2x/day while taking a narcotic pain medication. If no bowel movement after 48hours, you may additionally take a laxative like a bottle of Milk of Magnesia which can be purchased over the counter. Avoid enemas if possible as these are often painful.   Watch out for diarrhea.  If you have many loose bowel movements, simplify your diet to bland  foods.  Stop any stool softeners and decrease your fiber supplement. If this worsens or does not improve, please call us.  Wash / shower every day.  If you were discharged with a dressing, you may remove this the day after your surgery. You may shower normally, getting soap/water on your wound, particularly after bowel movements.  Soaking in a warm bath filled a couple inches ("Sitz bath") is a great way to clean the area after a bowel movement and many patients find it is a way to soothe the area.  ACTIVITIES as tolerated:   You may resume regular (light) daily activities beginning the next day--such as daily self-care, walking, climbing stairs--gradually increasing activities as tolerated.  If you can walk 30 minutes without difficulty, it is safe to try more intense activity such as jogging, treadmill, bicycling, low-impact aerobics, etc. Refrain from any heavy lifting or straining for the first 2 weeks after your procedure, particularly if your surgery was for hemorrhoids. Avoid activities that make your pain worse You may drive when you are no longer taking prescription pain medication, you can comfortably wear a seatbelt, and you can safely maneuver your car and apply brakes.  FOLLOW UP in our office Please call [facility] at [phone] to set up an appointment to see your surgeon in the office for a follow-up appointment approximately 2 weeks after your surgery. Make sure that you call for this appointment the day you arrive home to insure a convenient appointment time.  9. If you have disability or family leave forms that need to be completed,  you may have them completed by your primary care physician's office; for return to work instructions, please ask our office staff and they will be happy to assist you in obtaining this documentation   When to call us [phone]: Poor pain control Reactions / problems with new medications (rash/itching, etc)  Fever over 101.5 F (38.5 C) Inability  to urinate Nausea/vomiting Worsening swelling or bruising Continued bleeding from incision. Increased pain, redness, or drainage from the incision  The clinic staff is available to answer your questions during regular business hours (8:30am-5pm).  Please dont hesitate to call and ask to speak to one of our nurses for clinical concerns.   A surgeon from [facility] is always on call at the hospitals   If you have a medical emergency, go to the nearest emergency room or call 911.   [facility] [address], [place], [place]  [address] MAIN: [phone] FAX: [phone] [email or web address]  Information for Discharge Teaching: EXPAREL (bupivacaine liposome injectable suspension)   Your surgeon or anesthesiologist gave you EXPAREL(bupivacaine) to help control your pain after surgery.  EXPAREL is a local anesthetic that provides pain relief by numbing the tissue around the surgical site. EXPAREL is designed to release pain medication over time and can control pain for up to 72 hours. Depending on how you respond to EXPAREL, you may require less pain medication during your recovery.  Possible side effects: Temporary loss of sensation or ability to move in the area where bupivacaine was injected. Nausea, vomiting, constipation Rarely, numbness and tingling in your mouth or lips, lightheadedness, or anxiety may occur. Call your doctor right away if you think you may be experiencing any of these sensations, or if you have other questions regarding possible side effects.  Follow all other discharge instructions given to you by your surgeon or nurse. Eat a healthy diet and drink plenty of water or other fluids.  If you return to the hospital for any reason within 96 hours following the administration of EXPAREL, it is important for health care providers to know that you have received this anesthetic. A teal  colored band has been placed on your arm with the date, time and amount of EXPAREL you have received in order to alert and inform your health care providers. Please leave this armband in place for the full 96 hours following administration, and then you may remove the band.  Post Anesthesia Home Care Instructions  Activity: Get plenty of rest for the remainder of the day. A responsible individual must stay with you for 24 hours following the procedure.  For the next 24 hours, DO NOT: -Drive a car -Advertising copywriter -Drink alcoholic beverages -Take any medication unless instructed by your physician -Make any legal decisions or sign important papers.  Meals: Start with liquid foods such as gelatin or soup. Progress to regular foods as tolerated. Avoid greasy, spicy, heavy foods. If nausea and/or vomiting occur, drink only clear liquids until the nausea and/or vomiting subsides. Call your physician if vomiting continues.  Special Instructions/Symptoms: Your throat may feel dry or sore from the anesthesia or the breathing tube placed in your throat during surgery. If this causes discomfort, gargle with warm salt water. The discomfort should disappear within 24 hours.  If you had a scopolamine patch placed behind your ear for the management of post- operative nausea and/or vomiting:  1. The medication in the patch is effective for 72 hours, after which  it should be removed.  Wrap patch in a tissue and discard in the trash. Wash hands thoroughly with soap and water. 2. You may remove the patch earlier than 72 hours if you experience unpleasant side effects which may include dry mouth, dizziness or visual disturbances. 3. Avoid touching the patch. Wash your hands with soap and water after contact with the patch.     No Tylenol/acetaminophen products until 5pm or after

## 2021-07-02 ENCOUNTER — Encounter (HOSPITAL_BASED_OUTPATIENT_CLINIC_OR_DEPARTMENT_OTHER): Payer: Self-pay | Admitting: Surgery

## 2021-07-27 ENCOUNTER — Ambulatory Visit: Payer: 59 | Admitting: Family Medicine

## 2021-10-17 ENCOUNTER — Other Ambulatory Visit: Payer: Self-pay | Admitting: Student

## 2021-10-17 DIAGNOSIS — N360 Urethral fistula: Secondary | ICD-10-CM

## 2021-10-18 ENCOUNTER — Ambulatory Visit
Admission: RE | Admit: 2021-10-18 | Discharge: 2021-10-18 | Disposition: A | Discharge status: Home / Self Care | Payer: 59 | Source: Ambulatory Visit | Attending: Student | Admitting: Student

## 2021-10-18 DIAGNOSIS — N360 Urethral fistula: Secondary | ICD-10-CM

## 2021-10-18 IMAGING — MR MR PELVIS WO/W CM
6 of 8 series · 35 of 48 positions shown · IV contrast (multihance)
Comparison: Prior MR examinations [DATE] and [DATE]

CLINICAL DATA: History of perianal fistulas. Prior surgical
intervention.

EXAM:
MRI PELVIS WITHOUT AND WITH CONTRAST
TECHNIQUE: Multiplanar multisequence MR imaging of the pelvis was performed
both before and after administration of intravenous contrast.
CONTRAST:  13mL MULTIHANCE GADOBENATE DIMEGLUMINE 529 MG/ML IV SOLN

[Series 3: T2 · sagittal · 2.5mm · 1.02mm/px · 7 of 50 slices shown (1 of 2)]
[im 1/50]
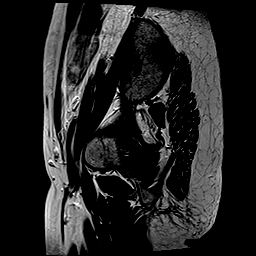
[im 9/50]
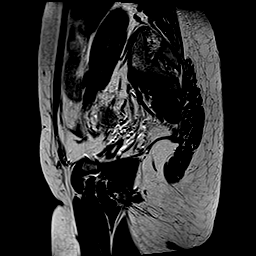
[im 17/50]
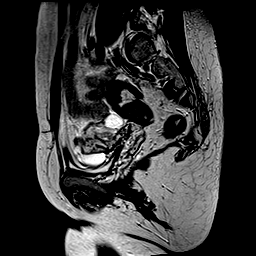
[im 25/50]
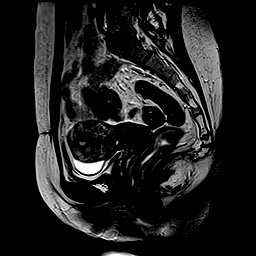
[im 33/50]
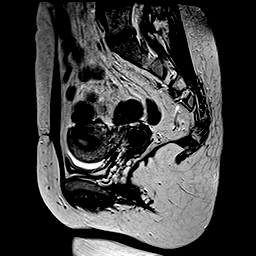
[im 41/50]
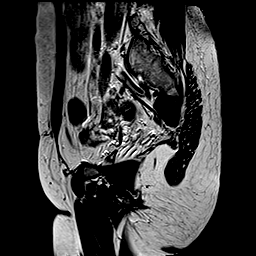
[im 50/50]
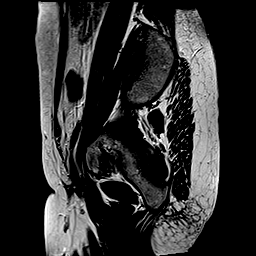

[Series 4: T2 fat-sat · sagittal · 2.5mm · 1.02mm/px · 7 of 50 slices shown (1 of 3)]
[im 1/50]
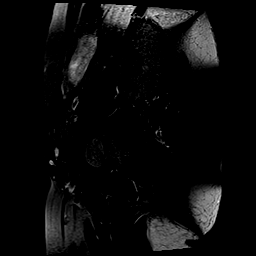
[im 9/50]
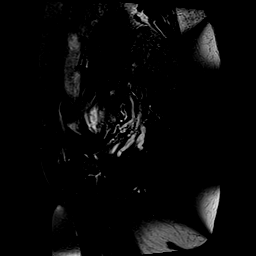
[im 17/50]
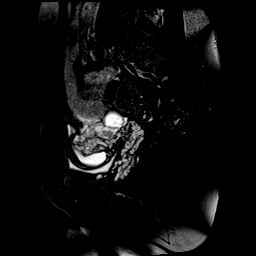
[im 25/50]
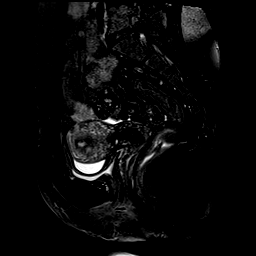
[im 33/50]
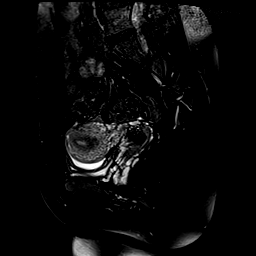
[im 41/50]
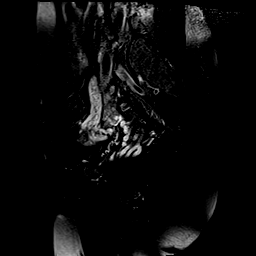
[im 50/50]
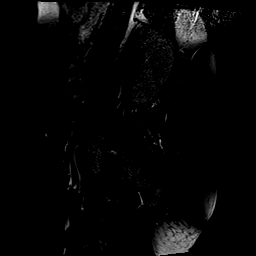

[Series 5: T1 · axial · 4.0mm · 0.86mm/px · z∈[-221,-46]mm · 6 of 48 slices shown]
[im 1/48]
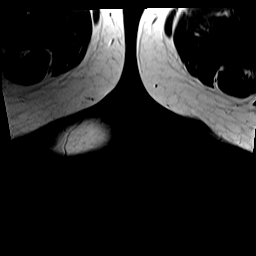
[im 10/48]
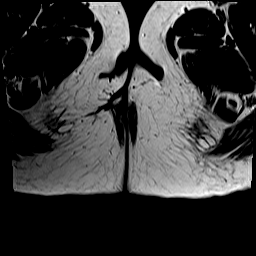
[im 19/48]
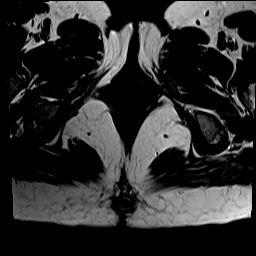
[im 29/48]
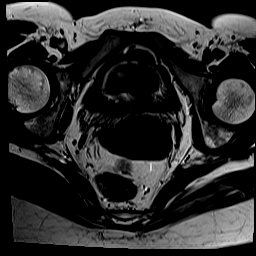
[im 38/48]
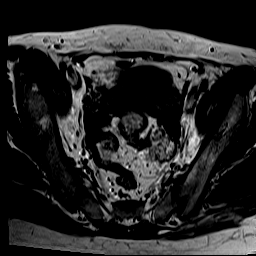
[im 48/48]
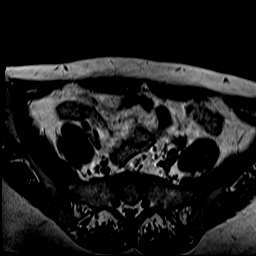

[Series 6: T2 · axial · 4.0mm · 0.43mm/px · z∈[-221,-46]mm · 6 of 48 slices shown (2 of 2)]
[im 1/48]
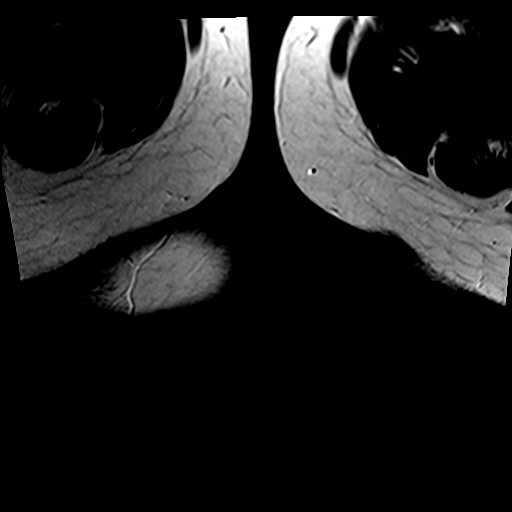
[im 10/48]
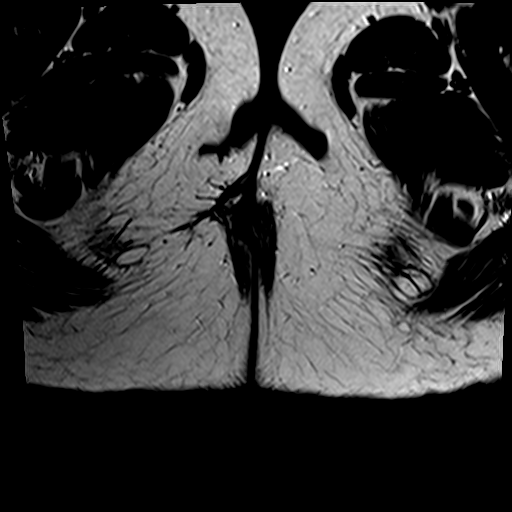
[im 19/48]
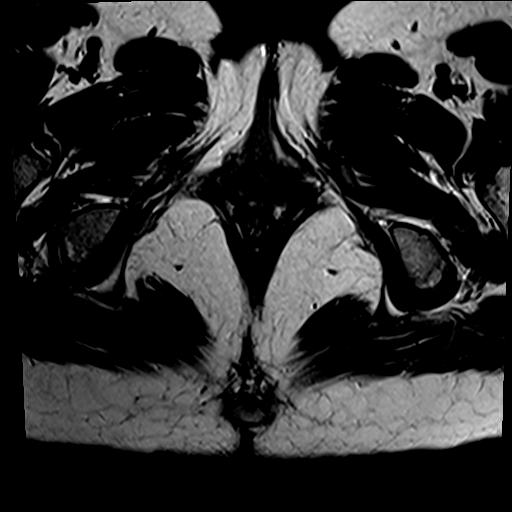
[im 29/48]
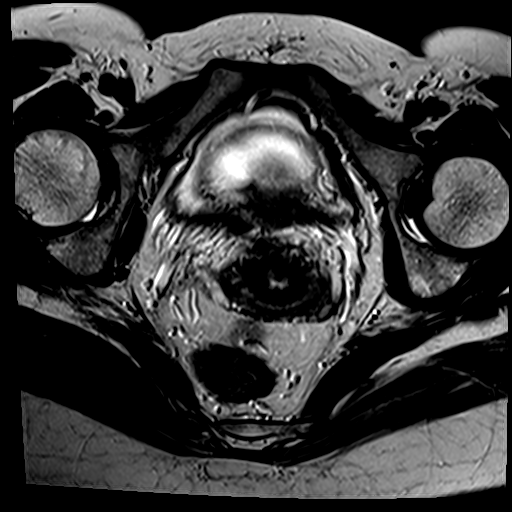
[im 38/48]
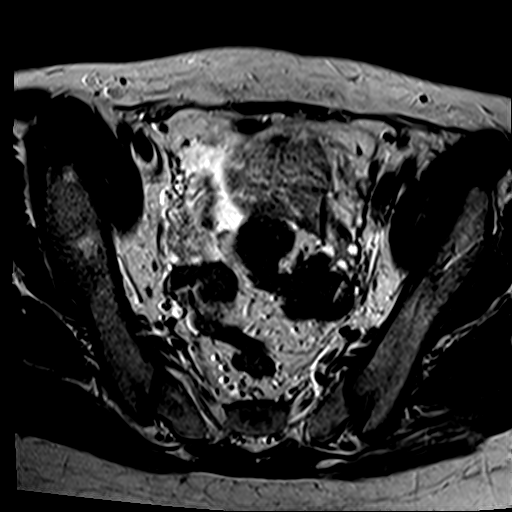
[im 48/48]
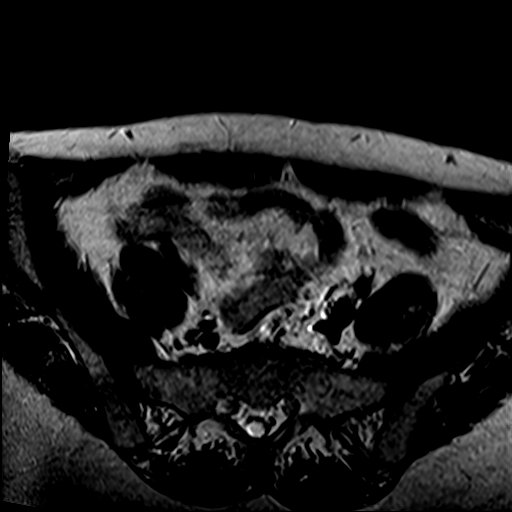

[Series 7: T2 fat-sat · axial · 4.0mm · 0.43mm/px · z∈[-221,-46]mm · 6 of 48 slices shown (2 of 3)]
[im 1/48]
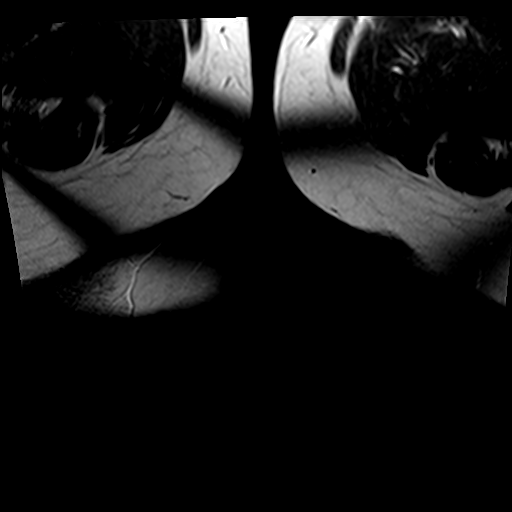
[im 10/48]
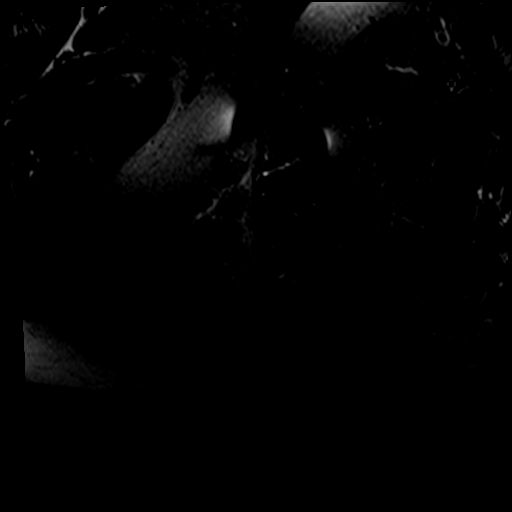
[im 19/48]
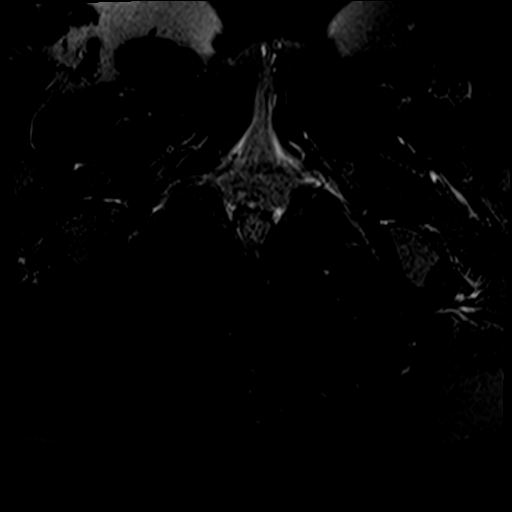
[im 29/48]
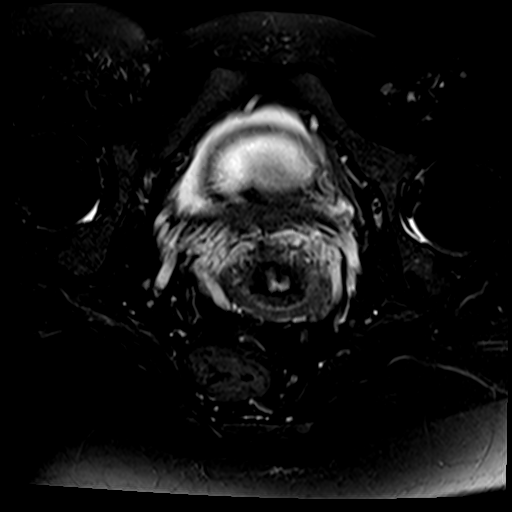
[im 38/48]
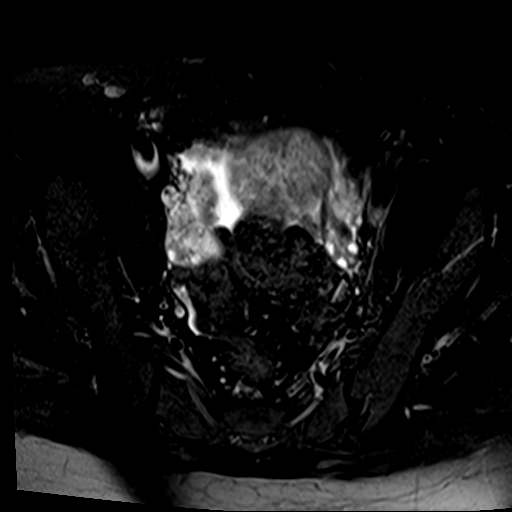
[im 48/48]
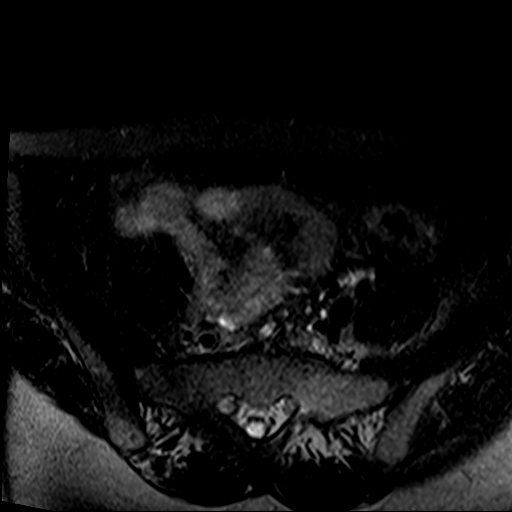

[Series 8: T2 fat-sat · coronal · 4.0mm · 0.94mm/px · 3 of 35 slices shown (3 of 3)]
[im 1/35]
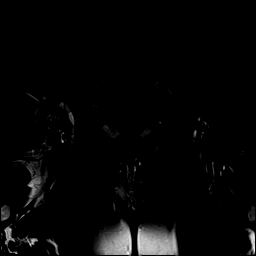
[im 9/35]
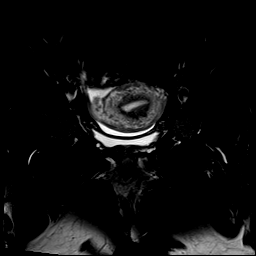
[im 18/35]
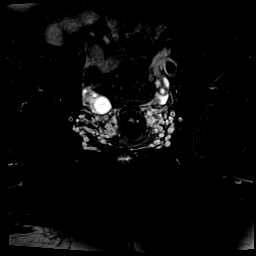

[35 of 48 positions shown; findings below may reference images not displayed]

FINDINGS: Urinary Tract:  The bladder is unremarkable.

Bowel: The rectum, sigmoid colon and visualized small bowel loops
are unremarkable.

There is a small transsphincteric perianal fistula located at
approximately the 10 to 11 o'clock position. This appears to extend
into a small pinhole tract to the lower peroneal/right gluteal cleft
area. More laterally there appears to be some remote enhancing scar
tissue. No abscess.

Vascular/Lymphatic: No pelvic or inguinal adenopathy.

Reproductive: The uterus and ovaries are unremarkable. C-section
scar is noted in the lower uterine segment anteriorly. There is also
a small amount of free pelvic fluid mainly between the uterus and
the bladder.

Other:  None

Musculoskeletal: Changes of osteitis condensans NYA. No acute bony
findings.
IMPRESSION: 1. Small transsphincteric perianal fistula located at approximately
the 10 to 11 o'clock position. This appears to extend into a small
pinhole tract to the lower peroneal/right gluteal cleft area. No
abscess.
2. Changes of osteitis condensans NYA.

## 2021-10-18 MED ORDER — GADOBENATE DIMEGLUMINE 529 MG/ML IV SOLN
13.0000 mL | Freq: Once | INTRAVENOUS | Status: AC | PRN
  Administered 2021-10-18: 13 mL via INTRAVENOUS

## 2021-10-29 ENCOUNTER — Encounter (HOSPITAL_COMMUNITY): Payer: Self-pay

## 2021-10-29 NOTE — Progress Notes (Signed)
36 y.o. G39P1002 Married Asian Not Hispanic or Latino female here for annual exam.  Currently not sexually active secondary to the fistula.  Period Cycle (Days): 28 Period Duration (Days): 5 Period Pattern: Regular Menstrual Flow: Light Dysmenorrhea: (!) Mild Dysmenorrhea Symptoms: Cramping She can saturate a pad in 1.5-2 hours for 1 day. It has gotten heavier, cramps are worse. Not anemic.   She c/o acne on her back, requests a refill on her retin-a cream. She doesn't currently have a primary care provider.  In June, 2022 she had a partial right radical vulvectomy and excision of a vulvar/ichiorectal abscess tract.   In 10/22 she had I&D of perineal abscess  OR 06/29/21 - LIFT for a transsphincteric anal fistula anteriorly based.    MRI pelvis 10/18/2021 which showed a 6 question of a small transsphincteric fistula which could also be scar in light of her history. Felt to have another possible fistula and is going to have another examination under anesthesia and possible fistulotomy with General Surgery scheduled next week.   She has a baseline discomfort on her perineum.  She c/o an increase in vaginal d/c, no itching, burning or irritation.  Patient's last menstrual period was 10/19/2021 (exact date).          Sexually active: Yes.    The current method of family planning is condoms .    Exercising: Yes.     Walk Smoker:  no  Health Maintenance: Pap:  2018 normal History of abnormal Pap:  no MMG:  N/A BMD:   N/A Colonoscopy: N/A TDaP:  2022 Gardasil: No   reports that she has never smoked. She has never used smokeless tobacco. She reports that she does not drink alcohol and does not use drugs. Works as an Airline pilot. She has twin boys (fraternal), 37 years old.   Past Medical History:  Diagnosis Date   Conjunctivitis right eye 06/17/2021   polytrim x 7 days all symptoms resolved   Hepatitis B surface antigen positive    Pneumonia    2018   Strep throat 06/17/2021    amoxicillin x 7 days all symptoms resolved   Vulvar abscess 2022   Wears glasses 10/19/2020   Wound infection after surgery 11/01/2020    Past Surgical History:  Procedure Laterality Date   CESAREAN SECTION  02/2016   INCISION AND DRAINAGE ABSCESS N/A 10/25/2020   Procedure: VULVA  ABSCESS TRACT EXCISION;  Surgeon: Carver Fila, MD;  Location: [facility];  Service: Gynecology;  Laterality: N/A;   INCISION AND DRAINAGE ABSCESS N/A 02/22/2021   Procedure: INCISION AND DRAINAGE OF PERIANAL WOUND;  Surgeon: Andria Meuse, MD;  Location: [facility];  Service: General;  Laterality: N/A;   INCISION AND DRAINAGE PERIRECTAL ABSCESS Right 07/26/2020   Procedure: IRRIGATION AND DEBRIDEMENT right labial ABSCESS;  Surgeon: Fritzi Mandes, MD;  Location: [facility];  Service: General;  Laterality: Right;   LIGATION OF INTERNAL FISTULA TRACT N/A 06/29/2021   Procedure: LIGATION OF INTERSPHINCTERIC FISTULOUS TRACT VS. FISTULOTOMY;  Surgeon: Andria Meuse, MD;  Location: [facility];  Service: General;  Laterality: N/A;   PLACEMENT OF SETON N/A 02/22/2021   Procedure: PLACEMENT OF SETON;  Surgeon: Andria Meuse, MD;  Location: [facility];  Service: General;  Laterality: N/A;   RECTAL EXAM UNDER ANESTHESIA N/A 02/22/2021   Procedure: ANORECTAL EXAM UNDER ANESTHESIA;  Surgeon: Andria Meuse, MD;  Location: [facility];  Service: General;  Laterality: N/A;   VULVAR LESION  REMOVAL N/A 11/02/2020   Procedure: Irrigation and Drainage of VULVAR LESION;  Surgeon: Carver Fila, MD;  Location: [facility];  Service: Gynecology;  Laterality: N/A;   WISDOM TOOTH EXTRACTION      Current Outpatient Medications  Medication Sig Dispense Refill   Cholecalciferol (VITAMIN D) 50 MCG (2000 UT) tablet Take 2,000 Units by mouth daily.     Multiple Vitamin (MULTIVITAMIN WITH MINERALS) TABS tablet Take 1 tablet by mouth daily.     No current  facility-administered medications for this visit.    Family History  Problem Relation Age of Onset   Healthy Mother    Diabetes Father    Cancer Maternal Grandmother    Breast cancer Maternal Grandmother    Cancer Maternal Grandfather    Lung cancer Maternal Grandfather    Healthy Paternal Grandmother    Heart disease Paternal Grandmother    Healthy Paternal Grandfather    Colon cancer Neg Hx    Ovarian cancer Neg Hx    Endometrial cancer Neg Hx    Pancreatic cancer Neg Hx    Prostate cancer Neg Hx     Review of Systems  All other systems reviewed and are negative.   Exam:   BP 116/74 (BP Location: Right Arm, Patient Position: Sitting, Cuff Size: Normal)   Pulse 74   Ht 5\' 5"  (1.651 m)   Wt 137 lb (62.1 kg)   LMP 10/19/2021 (Exact Date)   SpO2 98%   BMI 22.80 kg/m   Weight change: @WEIGHTCHANGE @ Height:   Height: 5\' 5"  (165.1 cm)  Ht Readings from Last 3 Encounters:  11/01/21 5\' 5"  (1.651 m)  10/30/21 5\' 5"  (1.651 m)  06/29/21 5\' 5"  (1.651 m)    General appearance: alert, cooperative and appears stated age Head: Normocephalic, without obvious abnormality, atraumatic Neck: no adenopathy, supple, symmetrical, trachea midline and thyroid normal to inspection and palpation Lungs: clear to auscultation bilaterally Cardiovascular: regular rate and rhythm Breasts: normal appearance, no masses or tenderness Abdomen: soft, non-tender; non distended,  no masses,  no organomegaly Extremities: extremities normal, atraumatic, no cyanosis or edema Skin: Skin color, texture, turgor normal. No rashes or lesions. She has moderate acne on her upper back. Lymph nodes: Cervical, supraclavicular, and axillary nodes normal. No abnormal inguinal nodes palpated Neurologic: Grossly normal   Pelvic: External genitalia:  no lesions, she has a fistula opening noted on her perineum to the right of and just underneath the vagina. She has scaring on the right buttock              Urethra:   normal appearing urethra with no masses, tenderness or lesions              Bartholins and Skenes: normal                 Vagina: normal appearing vagina with a slight increase in watery, vaginal discharge              Cervix: no lesions               Bimanual Exam:  Uterus:  normal size, contour, position, consistency, mobility, non-tender              Adnexa: no mass, fullness, tenderness               Rectovaginal: Confirms               Anus:  normal sphincter tone, no lesions  11/03/21,  [facility] chaperoned for the exam.  1. Well woman exam Discussed breast self exam Discussed calcium and vit D intake CBC and cmp just done  2. Screening for cervical cancer - Cytology - PAP  3. Menorrhagia with regular cycle Recent normal CBC Not interested in OCP's - TSH  4. Acne vulgaris Requests refill of her medication - tretinoin (RETIN-A) 0.025 % cream; Apply topically at bedtime.  Dispense: 45 g; Refill: 0 -Advised to avoid pregnancy while using this medication (no desire for pregnancy)  5. Vaginal discharge - WET PREP FOR TRICH, YEAST, CLUE: negative

## 2021-10-29 NOTE — Progress Notes (Addendum)
COVID Vaccine received:  []  No [x]  Yes x2  Date of any COVID positive Test in last 90 days: none  PCP - getting new one in July Cardiologist - none  Chest x-ray -  no EKG -  no Stress Test - no ECHO - no Cardiac Cath - no  Pacemaker/ICD device last checked: Date:       [x]  N/A Spinal Cord Stimulator:[x]  No []  Yes   Other Implants:   Bowel Prep -   no surgeon orders on 10-30-21, patient was given the Nurse number at [facility] to call and inquire about bowel prep.   History of Sleep Apnea? [x]  No []  Yes  []  unknown Sleep Study Date:   CPAP used?- [x]  No []  Yes  (Instruct to bring their mask & Tubing)  Does the patient monitor blood sugar? []  No []  Yes  [x]  N/A Does patient have a [employer] or Dexacom? []  No []  Yes   Fasting Blood Sugar Ranges-  Checks Blood Sugar _____ times a day  Blood Thinner Instructions:  none Aspirin Instructions: Last Dose:  Activity level:  Can go up a flight of stairs and perform activities of daily living without stopping and without symptoms of chest pain or shortness of breath.[]  No [x]  Yes  []  N/A  Able to do exercises without symptoms []  No [x]  Yes  []  N/A  Patient unable to complete ADLs without assistance [x]  No []  Yes  []  N/A    Comments:   Anesthesia review:   Patient denies shortness of breath, fever, cough and chest pain at PAT appointment Patient verbalized understanding and agreement to the Pre-Surgical Instructions that were given to them at this PAT appointment. Patient was also educated of the need to review these PAT instructions again prior to his/her surgery.I reviewed the appropriate phone numbers to call if they have any and questions or concerns.

## 2021-10-30 ENCOUNTER — Encounter (HOSPITAL_COMMUNITY): Payer: Self-pay

## 2021-10-30 ENCOUNTER — Other Ambulatory Visit: Payer: Self-pay

## 2021-10-30 ENCOUNTER — Encounter (HOSPITAL_COMMUNITY)
Admission: RE | Admit: 2021-10-30 | Discharge: 2021-10-30 | Disposition: A | Discharge status: Home / Self Care | Payer: 59 | Source: Ambulatory Visit | Attending: Surgery | Admitting: Surgery

## 2021-10-30 VITALS — BP 113/77 | HR 84 | Temp 98.7°F | Resp 16 | Ht 65.0 in | Wt 135.0 lb

## 2021-10-30 DIAGNOSIS — Z01818 Encounter for other preprocedural examination: Secondary | ICD-10-CM

## 2021-10-30 DIAGNOSIS — Z01812 Encounter for preprocedural laboratory examination: Secondary | ICD-10-CM | POA: Diagnosis present

## 2021-10-30 DIAGNOSIS — D649 Anemia, unspecified: Secondary | ICD-10-CM | POA: Diagnosis not present

## 2021-10-30 DIAGNOSIS — K759 Inflammatory liver disease, unspecified: Secondary | ICD-10-CM | POA: Diagnosis not present

## 2021-10-30 HISTORY — DX: Other specified abnormal immunological findings in serum: R76.8

## 2021-10-30 HISTORY — DX: Pneumonia, unspecified organism: J18.9

## 2021-10-30 LAB — COMPREHENSIVE METABOLIC PANEL
ALT: 15 U/L (ref 0–44)
AST: 17 U/L (ref 15–41)
Albumin: 4.8 g/dL (ref 3.5–5.0)
Alkaline Phosphatase: 57 U/L (ref 38–126)
Anion gap: 6 (ref 5–15)
BUN: 10 mg/dL (ref 6–20)
CO2: 27 mmol/L (ref 22–32)
Calcium: 9.5 mg/dL (ref 8.9–10.3)
Chloride: 107 mmol/L (ref 98–111)
Creatinine, Ser: 0.73 mg/dL (ref 0.44–1.00)
GFR, Estimated: >60 mL/min (ref >60)
Glucose, Bld: 124 mg/dL — ABNORMAL HIGH (ref 70–99)
Potassium: 4.4 mmol/L (ref 3.5–5.1)
Sodium: 140 mmol/L (ref 135–145)
Total Bilirubin: 0.6 mg/dL (ref 0.3–1.2)
Total Protein: 8.2 g/dL — ABNORMAL HIGH (ref 6.5–8.1)

## 2021-10-30 LAB — CBC
HCT: 42.8 % (ref 36.0–46.0)
Hemoglobin: 14.9 g/dL (ref 12.0–15.0)
MCH: 30.8 pg (ref 26.0–34.0)
MCHC: 34.8 g/dL (ref 30.0–36.0)
MCV: 88.6 fL (ref 80.0–100.0)
Platelets: 222 10*3/uL (ref 150–400)
RBC: 4.83 MIL/uL (ref 3.87–5.11)
RDW: 12 % (ref 11.5–15.5)
WBC: 5.6 10*3/uL (ref 4.0–10.5)
nRBC: 0 % (ref 0.0–0.2)

## 2021-10-31 ENCOUNTER — Ambulatory Visit: Payer: Self-pay | Admitting: Obstetrics and Gynecology

## 2021-11-01 ENCOUNTER — Encounter: Payer: Self-pay | Admitting: Obstetrics and Gynecology

## 2021-11-01 ENCOUNTER — Other Ambulatory Visit (HOSPITAL_COMMUNITY)
Admission: RE | Admit: 2021-11-01 | Discharge: 2021-11-01 | Disposition: A | Discharge status: Home / Self Care | Payer: 59 | Source: Ambulatory Visit | Attending: Obstetrics and Gynecology | Admitting: Obstetrics and Gynecology

## 2021-11-01 ENCOUNTER — Ambulatory Visit (INDEPENDENT_AMBULATORY_CARE_PROVIDER_SITE_OTHER): Payer: 59 | Admitting: Obstetrics and Gynecology

## 2021-11-01 VITALS — BP 116/74 | HR 74 | Ht 65.0 in | Wt 137.0 lb

## 2021-11-01 DIAGNOSIS — N898 Other specified noninflammatory disorders of vagina: Secondary | ICD-10-CM

## 2021-11-01 DIAGNOSIS — Z124 Encounter for screening for malignant neoplasm of cervix: Secondary | ICD-10-CM | POA: Diagnosis present

## 2021-11-01 DIAGNOSIS — L700 Acne vulgaris: Secondary | ICD-10-CM

## 2021-11-01 DIAGNOSIS — N920 Excessive and frequent menstruation with regular cycle: Secondary | ICD-10-CM | POA: Diagnosis not present

## 2021-11-01 DIAGNOSIS — Z01419 Encounter for gynecological examination (general) (routine) without abnormal findings: Secondary | ICD-10-CM | POA: Diagnosis not present

## 2021-11-01 LAB — WET PREP FOR TRICH, YEAST, CLUE

## 2021-11-01 MED ORDER — TRETINOIN 0.025 % EX CREA: TOPICAL_CREAM | Freq: Every day | CUTANEOUS | 0 refills | Status: AC

## 2021-11-01 NOTE — Patient Instructions (Signed)

## 2021-11-02 LAB — TSH: TSH: 2.33 mIU/L

## 2021-11-02 NOTE — Anesthesia Preprocedure Evaluation (Signed)
Anesthesia Evaluation  Patient identified by MRN, date of birth, ID band Patient awake    Reviewed: Allergy & Precautions, NPO status , Patient's Chart, lab work & pertinent test results  Airway Mallampati: II  TM Distance: >3 FB Neck ROM: Full    Dental  (+) Teeth Intact, Dental Advisory Given   Pulmonary neg pulmonary ROS,    Pulmonary exam normal breath sounds clear to auscultation       Cardiovascular negative cardio ROS Normal cardiovascular exam Rhythm:Regular Rate:Normal     Neuro/Psych negative neurological ROS  negative psych ROS   GI/Hepatic Neg liver ROS, GERD  Controlled,Recurrent anal fistula   Endo/Other  negative endocrine ROS  Renal/GU negative Renal ROS  negative genitourinary   Musculoskeletal negative musculoskeletal ROS (+)   Abdominal   Peds  Hematology negative hematology ROS (+)   Anesthesia Other Findings   Reproductive/Obstetrics negative OB ROS                            Anesthesia Physical Anesthesia Plan  ASA: 1  Anesthesia Plan: General   Post-op Pain Management: Tylenol PO (pre-op)* and Toradol IV (intra-op)*   Induction: Intravenous  PONV Risk Score and Plan: 3 and Ondansetron, Dexamethasone, Midazolam and Treatment may vary due to age or medical condition  Airway Management Planned: Oral ETT  Additional Equipment: None  Intra-op Plan:   Post-operative Plan: Extubation in OR  Informed Consent:   Plan Discussed with:   Anesthesia Plan Comments:         Anesthesia Quick Evaluation

## 2021-11-05 ENCOUNTER — Ambulatory Visit (HOSPITAL_COMMUNITY)
Admission: RE | Admit: 2021-11-05 | Discharge: 2021-11-05 | Disposition: A | Discharge status: Home / Self Care | Payer: Managed Care, Other (non HMO) | Source: Ambulatory Visit | Attending: Surgery | Admitting: Surgery

## 2021-11-05 ENCOUNTER — Encounter (HOSPITAL_COMMUNITY)
Admission: RE | Disposition: A | Discharge status: Home / Self Care | Payer: Self-pay | Source: Ambulatory Visit | Attending: Surgery

## 2021-11-05 ENCOUNTER — Ambulatory Visit (HOSPITAL_COMMUNITY): Payer: Managed Care, Other (non HMO) | Admitting: Anesthesiology

## 2021-11-05 ENCOUNTER — Other Ambulatory Visit: Payer: Self-pay

## 2021-11-05 ENCOUNTER — Encounter (HOSPITAL_COMMUNITY): Payer: Self-pay | Admitting: Surgery

## 2021-11-05 ENCOUNTER — Ambulatory Visit: Payer: Self-pay | Admitting: Surgery

## 2021-11-05 ENCOUNTER — Ambulatory Visit (HOSPITAL_BASED_OUTPATIENT_CLINIC_OR_DEPARTMENT_OTHER): Payer: Managed Care, Other (non HMO) | Admitting: Anesthesiology

## 2021-11-05 DIAGNOSIS — K603 Anal fistula: Secondary | ICD-10-CM | POA: Insufficient documentation

## 2021-11-05 DIAGNOSIS — K644 Residual hemorrhoidal skin tags: Secondary | ICD-10-CM | POA: Insufficient documentation

## 2021-11-05 DIAGNOSIS — Z01818 Encounter for other preprocedural examination: Secondary | ICD-10-CM

## 2021-11-05 DIAGNOSIS — K219 Gastro-esophageal reflux disease without esophagitis: Secondary | ICD-10-CM | POA: Diagnosis not present

## 2021-11-05 HISTORY — PX: EVALUATION UNDER ANESTHESIA WITH FISTULECTOMY: SHX5623

## 2021-11-05 HISTORY — PX: PLACEMENT OF SETON: SHX6029

## 2021-11-05 LAB — PREGNANCY, URINE: Preg Test, Ur: NEGATIVE

## 2021-11-05 SURGERY — EXAM UNDER ANESTHESIA WITH FISTULECTOMY
Anesthesia: General | Site: Rectum

## 2021-11-05 MED ORDER — AMISULPRIDE (ANTIEMETIC) 5 MG/2ML IV SOLN
10.0000 mg | Freq: Once | INTRAVENOUS | Status: DC | PRN
Start: 2021-11-05 — End: 2021-11-05

## 2021-11-05 MED ORDER — BUPIVACAINE LIPOSOME 1.3 % IJ SUSP
INTRAMUSCULAR | Status: DC | PRN
  Administered 2021-11-05: 12 mL

## 2021-11-05 MED ORDER — ROCURONIUM BROMIDE 10 MG/ML (PF) SYRINGE
PREFILLED_SYRINGE | INTRAVENOUS | Status: AC
  Filled 2021-11-05: qty 10

## 2021-11-05 MED ORDER — BUPIVACAINE LIPOSOME 1.3 % IJ SUSP: 20.0000 mL | Freq: Once | INTRAMUSCULAR | Status: DC

## 2021-11-05 MED ORDER — ONDANSETRON HCL 4 MG/2ML IJ SOLN
INTRAMUSCULAR | Status: DC | PRN
  Administered 2021-11-05: 4 mg via INTRAVENOUS

## 2021-11-05 MED ORDER — OXYCODONE HCL 5 MG/5ML PO SOLN: 5.0000 mg | Freq: Once | ORAL | Status: DC | PRN

## 2021-11-05 MED ORDER — ONDANSETRON HCL 4 MG/2ML IJ SOLN: 4.0000 mg | Freq: Once | INTRAMUSCULAR | Status: DC | PRN

## 2021-11-05 MED ORDER — MIDAZOLAM HCL 5 MG/5ML IJ SOLN
INTRAMUSCULAR | Status: DC | PRN
  Administered 2021-11-05: 2 mg via INTRAVENOUS

## 2021-11-05 MED ORDER — FENTANYL CITRATE (PF) 100 MCG/2ML IJ SOLN
INTRAMUSCULAR | Status: DC | PRN
  Administered 2021-11-05: 100 ug via INTRAVENOUS
  Administered 2021-11-05: 50 ug via INTRAVENOUS

## 2021-11-05 MED ORDER — KETOROLAC TROMETHAMINE 30 MG/ML IJ SOLN: 30.0000 mg | Freq: Once | INTRAMUSCULAR | Status: DC | PRN

## 2021-11-05 MED ORDER — MEPERIDINE HCL 50 MG/ML IJ SOLN: 6.2500 mg | INTRAMUSCULAR | Status: DC | PRN

## 2021-11-05 MED ORDER — ROCURONIUM BROMIDE 100 MG/10ML IV SOLN
INTRAVENOUS | Status: DC | PRN
  Administered 2021-11-05: 70 mg via INTRAVENOUS

## 2021-11-05 MED ORDER — DEXAMETHASONE SODIUM PHOSPHATE 4 MG/ML IJ SOLN
INTRAMUSCULAR | Status: DC | PRN
  Administered 2021-11-05: 8 mg via INTRAVENOUS

## 2021-11-05 MED ORDER — OXYCODONE HCL 5 MG PO TABS: 5.0000 mg | ORAL_TABLET | Freq: Once | ORAL | Status: DC | PRN

## 2021-11-05 MED ORDER — BUPIVACAINE-EPINEPHRINE 0.5% -1:200000 IJ SOLN
INTRAMUSCULAR | Status: DC | PRN
  Administered 2021-11-05: 18 mL

## 2021-11-05 MED ORDER — SUGAMMADEX SODIUM 200 MG/2ML IV SOLN
INTRAVENOUS | Status: DC | PRN
  Administered 2021-11-05: 150 mg via INTRAVENOUS

## 2021-11-05 MED ORDER — MIDAZOLAM HCL 2 MG/2ML IJ SOLN
INTRAMUSCULAR | Status: AC
  Filled 2021-11-05: qty 2

## 2021-11-05 MED ORDER — PROPOFOL 10 MG/ML IV BOLUS
INTRAVENOUS | Status: DC | PRN
  Administered 2021-11-05: 100 mg via INTRAVENOUS

## 2021-11-05 MED ORDER — FENTANYL CITRATE (PF) 100 MCG/2ML IJ SOLN
INTRAMUSCULAR | Status: AC
  Filled 2021-11-05: qty 2

## 2021-11-05 MED ORDER — OXYCODONE HCL 5 MG PO TABS: 5.0000 mg | ORAL_TABLET | Freq: Four times a day (QID) | ORAL | 0 refills | Status: AC | PRN

## 2021-11-05 MED ORDER — CHLORHEXIDINE GLUCONATE 0.12 % MT SOLN
15.0000 mL | Freq: Once | OROMUCOSAL | Status: AC
  Administered 2021-11-05: 15 mL via OROMUCOSAL

## 2021-11-05 MED ORDER — ACETAMINOPHEN 500 MG PO TABS: 1000.0000 mg | ORAL_TABLET | ORAL | Status: DC

## 2021-11-05 MED ORDER — BUPIVACAINE LIPOSOME 1.3 % IJ SUSP
INTRAMUSCULAR | Status: AC
  Filled 2021-11-05: qty 20

## 2021-11-05 MED ORDER — PROPOFOL 10 MG/ML IV BOLUS
INTRAVENOUS | Status: AC
Start: 2021-11-05 — End: ?
  Filled 2021-11-05: qty 20

## 2021-11-05 MED ORDER — 0.9 % SODIUM CHLORIDE (POUR BTL) OPTIME
TOPICAL | Status: DC | PRN
  Administered 2021-11-05: 1000 mL

## 2021-11-05 MED ORDER — ORAL CARE MOUTH RINSE: 15.0000 mL | Freq: Once | OROMUCOSAL | Status: AC

## 2021-11-05 MED ORDER — LACTATED RINGERS IV SOLN: INTRAVENOUS | Status: DC

## 2021-11-05 MED ORDER — DEXAMETHASONE SODIUM PHOSPHATE 10 MG/ML IJ SOLN
INTRAMUSCULAR | Status: AC
Start: 2021-11-05 — End: ?
  Filled 2021-11-05: qty 1

## 2021-11-05 MED ORDER — PHENYLEPHRINE HCL (PRESSORS) 10 MG/ML IV SOLN
INTRAVENOUS | Status: DC | PRN
  Administered 2021-11-05: 80 ug via INTRAVENOUS

## 2021-11-05 MED ORDER — ONDANSETRON HCL 4 MG/2ML IJ SOLN
INTRAMUSCULAR | Status: AC
Start: 2021-11-05 — End: ?
  Filled 2021-11-05: qty 2

## 2021-11-05 MED ORDER — LIDOCAINE HCL (PF) 2 % IJ SOLN
INTRAMUSCULAR | Status: AC
Start: 2021-11-05 — End: ?
  Filled 2021-11-05: qty 5

## 2021-11-05 MED ORDER — HYDROMORPHONE HCL 1 MG/ML IJ SOLN: 0.2500 mg | INTRAMUSCULAR | Status: DC | PRN

## 2021-11-05 MED ORDER — ACETAMINOPHEN 500 MG PO TABS
1000.0000 mg | ORAL_TABLET | Freq: Once | ORAL | Status: DC
  Administered 2021-11-05: 1000 mg via ORAL
  Filled 2021-11-05: qty 2

## 2021-11-05 SURGICAL SUPPLY — 35 items
BENZOIN TINCTURE PRP APPL 2/3 (GAUZE/BANDAGES/DRESSINGS) ×2 IMPLANT
BLADE SURG 15 STRL LF DISP TIS (BLADE) IMPLANT
BLADE SURG 15 STRL SS (BLADE) ×1
CNTNR URN SCR LID CUP LEK RST (MISCELLANEOUS) ×1 IMPLANT
CONT SPEC 4OZ STRL OR WHT (MISCELLANEOUS) ×1
COVER SURGICAL LIGHT HANDLE (MISCELLANEOUS) ×2 IMPLANT
DRAPE LAPAROTOMY T 102X78X121 (DRAPES) ×2 IMPLANT
DRSG PAD ABDOMINAL 8X10 ST (GAUZE/BANDAGES/DRESSINGS) ×1 IMPLANT
ELECT NDL BLADE 2-5/6 (NEEDLE) ×1 IMPLANT
ELECT NEEDLE BLADE 2-5/6 (NEEDLE) ×2 IMPLANT
ELECT REM PT RETURN 15FT ADLT (MISCELLANEOUS) ×2 IMPLANT
GAUZE 4X4 16PLY ~~LOC~~+RFID DBL (SPONGE) ×2 IMPLANT
GAUZE SPONGE 4X4 12PLY STRL (GAUZE/BANDAGES/DRESSINGS) ×1 IMPLANT
GLOVE BIO SURGEON STRL SZ7.5 (GLOVE) ×2 IMPLANT
GLOVE BIOGEL PI IND STRL 7.0 (GLOVE) IMPLANT
GLOVE BIOGEL PI INDICATOR 7.0 (GLOVE) ×3
GLOVE INDICATOR 8.0 STRL GRN (GLOVE) ×2 IMPLANT
GOWN STRL REUS W/ TWL XL LVL3 (GOWN DISPOSABLE) ×2 IMPLANT
GOWN STRL REUS W/TWL XL LVL3 (GOWN DISPOSABLE) ×2
KIT BASIN OR (CUSTOM PROCEDURE TRAY) ×2 IMPLANT
KIT TURNOVER KIT A (KITS) ×1 IMPLANT
LOOP VESSEL MAXI BLUE (MISCELLANEOUS) ×1 IMPLANT
NEEDLE HYPO 22GX1.5 SAFETY (NEEDLE) ×2 IMPLANT
NS IRRIG 1000ML POUR BTL (IV SOLUTION) ×1 IMPLANT
PACK BASIC VI WITH GOWN DISP (CUSTOM PROCEDURE TRAY) ×2 IMPLANT
PANTS MESH DISP LRG (UNDERPADS AND DIAPERS) ×2 IMPLANT
PENCIL BUTTON HOLSTER BLD 10FT (ELECTRODE) ×1 IMPLANT
SPIKE FLUID TRANSFER (MISCELLANEOUS) ×3 IMPLANT
SURGILUBE 2OZ TUBE FLIPTOP (MISCELLANEOUS) ×2 IMPLANT
SUT SILK 2 0 (SUTURE) ×1
SUT SILK 2-0 18XBRD TIE 12 (SUTURE) IMPLANT
SUT VIC AB 3-0 SH 27 (SUTURE) ×1
SUT VIC AB 3-0 SH 27X BRD (SUTURE) IMPLANT
SYR 20ML LL LF (SYRINGE) ×2 IMPLANT
TOWEL OR 17X26 10 PK STRL BLUE (TOWEL DISPOSABLE) ×2 IMPLANT

## 2021-11-05 NOTE — Anesthesia Postprocedure Evaluation (Signed)
Anesthesia Post Note  Patient: Sabrina Mullins  Procedure(s) Performed: CONTROL OF TRANSSPHINCTERIC ANAL FISTULA AND ANORECTAL EXAM UNDER ANESTHESIA (Rectum) PLACEMENT OF DRAINING SETON (Rectum)     Patient location during evaluation: PACU Anesthesia Type: General Level of consciousness: awake and alert, oriented and patient cooperative Pain management: pain level controlled Vital Signs Assessment: post-procedure vital signs reviewed and stable Respiratory status: spontaneous breathing, nonlabored ventilation and respiratory function stable Cardiovascular status: blood pressure returned to baseline and stable Postop Assessment: no apparent nausea or vomiting Anesthetic complications: no   No notable events documented.  Last Vitals:  Vitals:   11/05/21 1626 11/05/21 1630  BP: 109/88 121/77  Pulse: 62 62  Resp: 18   Temp: (!) 36.4 C   SpO2: 100% 100%    Last Pain:  Vitals:   11/05/21 1626  TempSrc:   PainSc: 0-No pain                 Lannie Fields

## 2021-11-05 NOTE — Discharge Instructions (Addendum)
ANORECTAL SURGERY: POST OP INSTRUCTIONS  DIET: Follow a light bland diet the first 24 hours after arrival home, such as soup, liquids, crackers, etc.  Be sure to include lots of fluids daily.  Avoid fast food or heavy meals as your are more likely to get nauseated.  Eat a low fat diet the next few days after surgery.   Some bleeding with bowel movements is expected for the first couple of days but this should stop in between bowel movements  Take your usually prescribed home medications unless otherwise directed. No foreign bodies per rectum for the next 3 months (enemas, etc)  PAIN CONTROL: It is helpful to take an over-the-counter pain medication regularly for the first few days/weeks.  Choose from the following that works best for you: Ibuprofen (Advil, etc) Three 200mg tabs every 6 hours as needed. Acetaminophen (Tylenol, etc) 500-650mg every 6 hours as needed NOTE: You may take both of these medications together - most patients find it most helpful when alternating between the two (i.e. Ibuprofen at 6am, tylenol at 9am, ibuprofen at 12pm ...) A  prescription for pain medication may have been prescribed for you at discharge.  Take your pain medication as prescribed.  If you are having problems/concerns with the prescription medicine, please call us for further advice.  Avoid getting constipated.  Between the surgery and the pain medications, it is common to experience some constipation.  Increasing fluid intake (64oz of water per day) and taking a fiber supplement (such as Metamucil, Citrucel, FiberCon) 1-2 times a day regularly will usually help prevent this problem from occurring.  Take Miralax (over the counter) 1-2x/day while taking a narcotic pain medication. If no bowel movement after 48hours, you may additionally take a laxative like a bottle of Milk of Magnesia which can be purchased over the counter. Avoid enemas.   Watch out for diarrhea.  If you have many loose bowel movements,  simplify your diet to bland foods.  Stop any stool softeners and decrease your fiber supplement. If this worsens or does not improve, please call us.  Wash / shower every day.  If you were discharged with a dressing, you may remove this the day after your surgery. You may shower normally, getting soap/water on your wound, particularly after bowel movements.  Soaking in a warm bath filled a couple inches ("Sitz bath") is a great way to clean the area after a bowel movement and many patients find it is a way to soothe the area.  ACTIVITIES as tolerated:   You may resume regular (light) daily activities beginning the next day--such as daily self-care, walking, climbing stairs--gradually increasing activities as tolerated.  If you can walk 30 minutes without difficulty, it is safe to try more intense activity such as jogging, treadmill, bicycling, low-impact aerobics, etc. Refrain from any heavy lifting or straining for the first 2 weeks after your procedure, particularly if your surgery was for hemorrhoids. Avoid activities that make your pain worse You may drive when you are no longer taking prescription pain medication, you can comfortably wear a seatbelt, and you can safely maneuver your car and apply brakes.  FOLLOW UP in our office Please call [facility] at [phone] to set up an appointment to see your surgeon in the office for a follow-up appointment approximately 2 weeks after your surgery. Make sure that you call for this appointment the day you arrive home to insure a convenient appointment time.  9. If you have disability or family leave forms   that need to be completed, you may have them completed by your primary care physician's office; for return to work instructions, please ask our office staff and they will be happy to assist you in obtaining this documentation   When to call us [phone]: Poor pain control Reactions / problems with new medications (rash/itching, etc)  Fever over  101.5 F (38.5 C) Inability to urinate Nausea/vomiting Worsening swelling or bruising Continued bleeding from incision. Increased pain, redness, or drainage from the incision  The clinic staff is available to answer your questions during regular business hours (8:30am-5pm).  Please don't hesitate to call and ask to speak to one of our nurses for clinical concerns.   A surgeon from [facility] is always on call at the hospitals   If you have a medical emergency, go to the nearest emergency room or call 911.   [facility] [address], [address], [place], [place]  [address] MAIN: [phone] FAX: [phone] [email or web address] 

## 2021-11-05 NOTE — Anesthesia Procedure Notes (Signed)
Procedure Name: Intubation Date/Time: 11/05/2021 2:50 PM  Performed by: Gean Maidens, CRNAPre-anesthesia Checklist: Patient identified, Emergency Drugs available, Suction available, Patient being monitored and Timeout performed Patient Re-evaluated:Patient Re-evaluated prior to induction Oxygen Delivery Method: Circle system utilized Preoxygenation: Pre-oxygenation with 100% oxygen Induction Type: IV induction Ventilation: Mask ventilation without difficulty Laryngoscope Size: Mac and 3 Grade View: Grade I Tube type: Oral Tube size: 7.0 mm Number of attempts: 1 Airway Equipment and Method: Stylet Placement Confirmation: ETT inserted through vocal cords under direct vision, positive ETCO2 and breath sounds checked- equal and bilateral Secured at: 21 cm Tube secured with: Tape Dental Injury: Teeth and Oropharynx as per pre-operative assessment

## 2021-11-05 NOTE — H&P (Signed)
CC: Here today for surgery  HPI: Sabrina Mullins is an 36 y.o. female  who has previously undergone EUA/incision and drainage of right labial abscess 07/26/2020. She was found on imaging to have an abscess adjacent to the vagina and rectum. There is no evident communication with rectum. She subsequently had to separate CTs one on 08/10/20 and another 09/14/20. The one on 5/12 was to evaluate for some yellowish discharge she has had from the right posterior labial wall.  She had a pelvic MRI completed 09/22/20 to further evaluate things given the negative intraoperative examination by Dr. Freida Busman for any communication with the anal canal. They favored this being a Bartholin's cyst on imaging and there is no evident fistula or communication with the anal canal/rectum.  She was ultimately taken to the operating room by Dr. Pricilla Holm and underwent EUA. She was found to have her labial wound opened and there is no evident fistulization into the anal canal that could be seen at that time.  She underwent repeat pelvic MRI 12/25/2020 with Dr. Pricilla Holm that showed postsurgical changes of a perineal abscess drainage. Short fistulous tract along the inferior and right anterior aspect of the anal sphincter which extends to the gluteal crease but there is no evidence of abscess or communication with the anal sphincter.  She has done okay since. She has not had any pain to the degree when she had an abscess before. She has had ongoing drainage from a wound that abuts her surgical incision and drainage site. This has persisted.  OR 02/22/21 -incision and drainage of perineal abscess, placement of draining seton, anorectal exam under anesthesia.  Findings: Normal-appearing anoderm with a 1 cc pus containing pocket on the right posterior aspect of the labia. This was opened and drained. Tract found to communicate as a fistula which is Laveda [facility] sphincteric through the right anterior midline anal canal to the level of the dentate line. A  draining seton was placed.  OR 06/29/21 - LIFT for a transsphincteric anal fistula anteriorly based.   Previously noted some persistent drainage from external opening of her fistula. Intermittent swelling/pain. Reports that she read online that this may indicate she has a persistent fistula. We spent time reassuring her today.   INTERVAL HX Seen back in urgent office 10/15/2021 with some vague perineal discomfort. Evaluation that time demonstrated a closed incision without any evidence of abscess, external opening, or tenderness within her perineum.  She did have an MRI pelvis 10/18/2021 which showed a 6 question of a small transsphincteric fistula which could also be scar in light of her history.   Reports discomfort with activity including walking in the perineum with scant clearish yellow drainage, less than 1 gauze pad amount per day. No fever/chills or significant pain like when she had a prior abscess. She reports that this remains problematic from a quality-of-life standpoint. Denies any issue with incontinence to gas, liquid, or solid stool.   Past Medical History:  Diagnosis Date   Conjunctivitis right eye 06/17/2021   polytrim x 7 days all symptoms resolved   Hepatitis B surface antigen positive    Pneumonia    2018   Strep throat 06/17/2021   amoxicillin x 7 days all symptoms resolved   Vulvar abscess 2022   Wears glasses 10/19/2020   Wound infection after surgery 11/01/2020    Past Surgical History:  Procedure Laterality Date   CESAREAN SECTION  02/2016   INCISION AND DRAINAGE ABSCESS N/A 10/25/2020   Procedure: VULVA  ABSCESS  TRACT EXCISION;  Surgeon: Carver Fila, MD;  Location: [facility];  Service: Gynecology;  Laterality: N/A;   INCISION AND DRAINAGE ABSCESS N/A 02/22/2021   Procedure: INCISION AND DRAINAGE OF PERIANAL WOUND;  Surgeon: Andria Meuse, MD;  Location: [facility];  Service: General;  Laterality: N/A;   INCISION AND DRAINAGE  PERIRECTAL ABSCESS Right 07/26/2020   Procedure: IRRIGATION AND DEBRIDEMENT right labial ABSCESS;  Surgeon: Fritzi Mandes, MD;  Location: [facility];  Service: General;  Laterality: Right;   LIGATION OF INTERNAL FISTULA TRACT N/A 06/29/2021   Procedure: LIGATION OF INTERSPHINCTERIC FISTULOUS TRACT VS. FISTULOTOMY;  Surgeon: Andria Meuse, MD;  Location: [facility];  Service: General;  Laterality: N/A;   PLACEMENT OF SETON N/A 02/22/2021   Procedure: PLACEMENT OF SETON;  Surgeon: Andria Meuse, MD;  Location: [facility];  Service: General;  Laterality: N/A;   RECTAL EXAM UNDER ANESTHESIA N/A 02/22/2021   Procedure: ANORECTAL EXAM UNDER ANESTHESIA;  Surgeon: Andria Meuse, MD;  Location: [facility];  Service: General;  Laterality: N/A;   VULVAR LESION REMOVAL N/A 11/02/2020   Procedure: Irrigation and Drainage of VULVAR LESION;  Surgeon: Carver Fila, MD;  Location: [facility];  Service: Gynecology;  Laterality: N/A;   WISDOM TOOTH EXTRACTION      Family History  Problem Relation Age of Onset   Healthy Mother    Diabetes Father    Cancer Maternal Grandmother    Breast cancer Maternal Grandmother    Cancer Maternal Grandfather    Lung cancer Maternal Grandfather    Healthy Paternal Grandmother    Heart disease Paternal Grandmother    Healthy Paternal Grandfather    Colon cancer Neg Hx    Ovarian cancer Neg Hx    Endometrial cancer Neg Hx    Pancreatic cancer Neg Hx    Prostate cancer Neg Hx     Social:  reports that she has never smoked. She has never used smokeless tobacco. She reports that she does not drink alcohol and does not use drugs.  Allergies:  Allergies  Allergen Reactions   Flagyl [Metronidazole] Nausea Only and Other (See Comments)    Dizzy; difficulty concentrating    Medications: I have reviewed the patient's current medications.  Results for orders placed or performed during the hospital encounter of 11/05/21  (from the past 48 hour(s))  Pregnancy, urine per protocol     Status: None   Collection Time: 11/05/21 12:09 PM  Result Value Ref Range   Preg Test, Ur NEGATIVE NEGATIVE    Comment:        THE SENSITIVITY OF THIS METHODOLOGY IS >20 mIU/mL. Performed at [facility], [address] [place], [place] [address]     No results found.  ROS - all of the below systems have been reviewed with the patient and positives are indicated with bold text General: chills, fever or night sweats Eyes: blurry vision or double vision ENT: epistaxis or sore throat Allergy/Immunology: itchy/watery eyes or nasal congestion Hematologic/Lymphatic: bleeding problems, blood clots or swollen lymph nodes Endocrine: temperature intolerance or unexpected weight changes Breast: new or changing breast lumps or nipple discharge Resp: cough, shortness of breath, or wheezing CV: chest pain or dyspnea on exertion GI: as per HPI GU: dysuria, trouble voiding, or hematuria MSK: joint pain or joint stiffness Neuro: TIA or stroke symptoms Derm: pruritus and skin lesion changes Psych: anxiety and depression  PE Blood pressure 122/71, pulse 67,  temperature 98.8 F (37.1 C), temperature source Oral, resp. rate 16, height 5\' 5"  (1.651 m), weight 62 kg, last menstrual period 10/19/2021, SpO2 97 %. Constitutional: NAD; conversant Eyes: Moist conjunctiva; no lid lag; anicteric Lungs: Normal respiratory effort CV: RRR MSK: Normal range of motion of extremities; no clubbing/cyanosis Psychiatric: Appropriate affect; alert and oriented x3  Results for orders placed or performed during the hospital encounter of 11/05/21 (from the past 48 hour(s))  Pregnancy, urine per protocol     Status: None   Collection Time: 11/05/21 12:09 PM  Result Value Ref Range   Preg Test, Ur NEGATIVE NEGATIVE    Comment:        THE SENSITIVITY OF THIS METHODOLOGY IS >20 mIU/mL. Performed at [facility],  [address] [place], [place]     No results found.  A/P: Sabrina Mullins is an 36 y.o. female with hx of perineal wound/abscess - found to have transsphincteric anteriorly based anal fistula - now s/p LIFT 06/29/21 - has punctate opening in introitus R side at location of prior EO  Pelvic MRI 10/18/21 suggests possibility of small transsphincteric anal fistula  -Putting her whole picture together she may have a shallow/subcutaneous fistula from the left incision to her external opening. This has continued to be somewhat bothersome to her from a quality-of-life standpoint including intermittent pains and scant drainage. We discussed options going forward including further observation versus surgery-anorectal exam under anesthesia, reclassification of fistula type based upon intraoperative findings, possibility of fistulotomy versus seton based on this. -She would like to proceed with another procedure to further evaluate things given the impact to her quality of life.   10/20/21, MD [facility]

## 2021-11-05 NOTE — Transfer of Care (Signed)
Immediate Anesthesia Transfer of Care Note  Patient: Sabrina Mullins  Procedure(s) Performed: CONTROL OF TRANSSPHINCTERIC ANAL FISTULA AND ANORECTAL EXAM UNDER ANESTHESIA (Rectum) PLACEMENT OF DRAINING SETON (Rectum)  Patient Location: PACU  Anesthesia Type:General  Level of Consciousness: sedated, patient cooperative and responds to stimulation  Airway & Oxygen Therapy: Patient Spontanous Breathing and Patient connected to face mask oxygen  Post-op Assessment: Report given to RN and Post -op Vital signs reviewed and stable  Post vital signs: Reviewed and stable  Last Vitals:  Vitals Value Taken Time  BP 108/64 11/05/21 1530  Temp    Pulse 91 11/05/21 1531  Resp 29 11/05/21 1531  SpO2 100 % 11/05/21 1531  Vitals shown include unvalidated device data.  Last Pain:  Vitals:   11/05/21 1304  TempSrc:   PainSc: 0-No pain         Complications: No notable events documented.

## 2021-11-05 NOTE — Progress Notes (Signed)
Call to Dr. Cliffton Asters. Discussed no order for abx, per Dr. Cliffton Asters, no abx needed, no new orders. Nursing Communication order re: above placed.

## 2021-11-05 NOTE — Op Note (Signed)
11/05/2021  3:39 PM  PATIENT:  Sabrina Mullins  36 y.o. female  Patient Care Team: Pcp, No as PCP - General  PRE-OPERATIVE DIAGNOSIS:  Perianal wound, possible anal fistula  POST-OPERATIVE DIAGNOSIS:  Transsphincteric anal fistula, recurrent  PROCEDURE:   Control of transsphincteric anal fistula with placement of draining seton Excision of hypertrophic anal papilla/tag Anorectal exam under anesthesia  SURGEON:  Surgeon(s): Andria Meuse, MD  ASSISTANT: OR staff   ANESTHESIA:   local and general  SPECIMEN:  Anal tag  DISPOSITION OF SPECIMEN:  PATHOLOGY  COUNTS:  Sponge, needle, and instrument counts were reported correct x2 at conclusion.  EBL: 5 mL  Drains:Blue vessel loop draining seton  PLAN OF CARE: Discharge to home after PACU  PATIENT DISPOSITION:  PACU - hemodynamically stable.  OR FINDINGS: Low transsphincteric anal fistula with internal opening just distal to dentate right anterior; external opening in right distal aspect of introitus, somewhat inflamed external opening. Consistent with recurrent anal fistula  DESCRIPTION: The patient was identified in the preoperative holding area and taken to the OR. SCDs were applied. She then underwent general endotracheal anesthesia without difficulty. The patient was then rolled onto the OR table in the prone jackknife position. Pressure points were then evaluated and padded. Benzoin was applied to the buttocks and they were gently taped apart.  She was then prepped and draped in usual sterile fashion.  A surgical timeout was performed indicating the correct patient, procedure, and positioning.  A perianal block was then created using a dilute mixture of 0.25% Marcaine with epinephrine and Exparel.  After ascertaining an appropriate level of anesthesia had been achieved, a well lubricated digital rectal exam was performed. This demonstrated no palpable masses.  A Hill-Ferguson anoscope was into the anal canal and  circumferential inspection demonstrated granulation tissue in the right anterior position.  Prolapsing hypertrophic anal papilla left anterior.   Candidate external opening at the distalmost aspect of the introitus just to the right of midline.  A lubricated fistula probe was gently inserted taking care to prevent any sort of false passages and advanced.  We are able to identify a relatively straight tract emanating to the right anterior position of the anal canal just distal to the dentate line.  The external opening of this is somewhat inflamed in appearance consistent with her symptoms and likely recurrent abscess/drainage.  Fistula does appear to be transsphincteric's were able to palpate both the internal and external sphincter muscle.  The prior LIFT incision appears healed without any evident open wound or evident tract emanating towards it.  Given these constellation of findings, we opted to proceed with placing a draining blue vessel loop seton to allow adequate drainage, resolution of inflammation, and potentially subsequent procedure to address her fistula.  The rigid fistula probe was exchanged for a blue vessel loop seton which was secured to itself using 2-0 silk ties.  She has a prolapsing hypertrophic anal papilla that is likely location of a prior thrombosed internal hemorrhoid.  We did excise this and submitted for pathology which would also serve as essentially a perianal biopsy to rule out other inflammatory condition such as Crohn's.  There are no fissures.  There are no other evident anal fistulas or wounds.  Additional local anesthetic is infiltrated.  All counts are reported correct.  The buttocks are untaped and a dressing consisting of 4 x 4's, ABD, and mesh underwear was placed.  She is then rolled back onto a stretcher, awakened from anesthesia, extubated, and  transferred to the recovery room in satisfactory condition.  DISPOSITION: PACU in satisfactory condition.

## 2021-11-06 ENCOUNTER — Encounter (HOSPITAL_COMMUNITY): Payer: Self-pay | Admitting: Surgery

## 2021-11-07 LAB — CYTOLOGY - PAP
Comment: NEGATIVE
Diagnosis: NEGATIVE
Diagnosis: REACTIVE
High risk HPV: NEGATIVE

## 2021-11-07 LAB — SURGICAL PATHOLOGY

## 2021-11-12 ENCOUNTER — Encounter: Payer: Self-pay | Admitting: Obstetrics and Gynecology
